# Patient Record
Sex: Female | Born: 1985 | Race: White | Hispanic: No | Marital: Single | Smoking: Current every day smoker
Health system: Southern US, Community
[De-identification: ages and names within clinical notes are randomized; demographics above are authoritative.]

## PROBLEM LIST (undated history)

## (undated) DIAGNOSIS — Z789 Other specified health status: Secondary | ICD-10-CM

## (undated) DIAGNOSIS — I10 Essential (primary) hypertension: Secondary | ICD-10-CM

## (undated) HISTORY — PX: LEEP: SHX91

## (undated) HISTORY — PX: CHOLECYSTECTOMY: SHX55

## (undated) HISTORY — PX: OTHER SURGICAL HISTORY: SHX169

---

## 2011-06-07 HISTORY — PX: LEEP: SHX91

## 2012-07-06 ENCOUNTER — Observation Stay: Payer: Self-pay | Admitting: Obstetrics and Gynecology

## 2013-02-14 ENCOUNTER — Ambulatory Visit: Payer: Self-pay | Admitting: Physician Assistant

## 2013-05-21 ENCOUNTER — Ambulatory Visit: Payer: Self-pay | Admitting: Family Medicine

## 2013-05-21 LAB — URINALYSIS, COMPLETE
Bacteria: NEGATIVE
Ketone: NEGATIVE
Ph: 7 (ref 4.5–8.0)
Protein: NEGATIVE

## 2013-12-07 ENCOUNTER — Emergency Department: Payer: Self-pay | Admitting: Emergency Medicine

## 2013-12-07 LAB — COMPREHENSIVE METABOLIC PANEL
ALT: 33 U/L (ref 12–78)
Albumin: 3.6 g/dL (ref 3.4–5.0)
Alkaline Phosphatase: 76 U/L
Anion Gap: 6 — ABNORMAL LOW (ref 7–16)
BUN: 11 mg/dL (ref 7–18)
Bilirubin,Total: 0.3 mg/dL (ref 0.2–1.0)
CALCIUM: 8.5 mg/dL (ref 8.5–10.1)
Chloride: 107 mmol/L (ref 98–107)
Co2: 25 mmol/L (ref 21–32)
Creatinine: 0.84 mg/dL (ref 0.60–1.30)
EGFR (African American): >60
Glucose: 99 mg/dL (ref 65–99)
Osmolality: 275 (ref 275–301)
Potassium: 3.5 mmol/L (ref 3.5–5.1)
SGOT(AST): 30 U/L (ref 15–37)
Sodium: 138 mmol/L (ref 136–145)
Total Protein: 7.5 g/dL (ref 6.4–8.2)

## 2013-12-07 LAB — LIPASE, BLOOD: LIPASE: 130 U/L (ref 73–393)

## 2013-12-07 LAB — CBC WITH DIFFERENTIAL/PLATELET
BASOS PCT: 0.4 %
Basophil #: 0.1 10*3/uL (ref 0.0–0.1)
EOS PCT: 0.7 %
Eosinophil #: 0.1 10*3/uL (ref 0.0–0.7)
HCT: 40.8 % (ref 35.0–47.0)
HGB: 13.8 g/dL (ref 12.0–16.0)
LYMPHS ABS: 4.7 10*3/uL — AB (ref 1.0–3.6)
LYMPHS PCT: 28.6 %
MCH: 30.9 pg (ref 26.0–34.0)
MCHC: 33.9 g/dL (ref 32.0–36.0)
MCV: 91 fL (ref 80–100)
Monocyte #: 0.7 x10 3/mm (ref 0.2–0.9)
Monocyte %: 4.1 %
Neutrophil #: 10.9 10*3/uL — ABNORMAL HIGH (ref 1.4–6.5)
Neutrophil %: 66.2 %
PLATELETS: 257 10*3/uL (ref 150–440)
RBC: 4.47 10*6/uL (ref 3.80–5.20)
RDW: 14.3 % (ref 11.5–14.5)
WBC: 16.4 10*3/uL — AB (ref 3.6–11.0)

## 2013-12-07 LAB — TROPONIN I: Troponin-I: <0.02 ng/mL

## 2013-12-08 LAB — URINALYSIS, COMPLETE
BLOOD: NEGATIVE
Bilirubin,UR: NEGATIVE
Glucose,UR: NEGATIVE mg/dL (ref 0–75)
KETONE: NEGATIVE
Leukocyte Esterase: NEGATIVE
Nitrite: NEGATIVE
PH: 5 (ref 4.5–8.0)
Protein: NEGATIVE
SPECIFIC GRAVITY: 1.025 (ref 1.003–1.030)

## 2013-12-08 LAB — SALICYLATE LEVEL: Salicylates, Serum: 5.1 mg/dL — ABNORMAL HIGH

## 2013-12-08 LAB — ACETAMINOPHEN LEVEL: Acetaminophen: <2 ug/mL

## 2014-02-08 ENCOUNTER — Emergency Department: Payer: Self-pay | Admitting: Emergency Medicine

## 2014-02-08 LAB — COMPREHENSIVE METABOLIC PANEL
ALK PHOS: 85 U/L
Albumin: 3.3 g/dL — ABNORMAL LOW (ref 3.4–5.0)
Anion Gap: 9 (ref 7–16)
BILIRUBIN TOTAL: 0.6 mg/dL (ref 0.2–1.0)
BUN: 6 mg/dL — ABNORMAL LOW (ref 7–18)
Calcium, Total: 9 mg/dL (ref 8.5–10.1)
Chloride: 110 mmol/L — ABNORMAL HIGH (ref 98–107)
Co2: 21 mmol/L (ref 21–32)
Creatinine: 0.69 mg/dL (ref 0.60–1.30)
EGFR (African American): >60
GLUCOSE: 108 mg/dL — AB (ref 65–99)
Osmolality: 278 (ref 275–301)
Potassium: 3.4 mmol/L — ABNORMAL LOW (ref 3.5–5.1)
SGOT(AST): 9 U/L — ABNORMAL LOW (ref 15–37)
SGPT (ALT): 17 U/L
Sodium: 140 mmol/L (ref 136–145)
TOTAL PROTEIN: 7.3 g/dL (ref 6.4–8.2)

## 2014-02-08 LAB — CBC WITH DIFFERENTIAL/PLATELET
Basophil #: 0.1 10*3/uL (ref 0.0–0.1)
Basophil %: 0.4 %
Eosinophil #: 0 10*3/uL (ref 0.0–0.7)
Eosinophil %: 0.2 %
HCT: 44.6 % (ref 35.0–47.0)
HGB: 15.1 g/dL (ref 12.0–16.0)
LYMPHS ABS: 2.8 10*3/uL (ref 1.0–3.6)
LYMPHS PCT: 15.3 %
MCH: 31.4 pg (ref 26.0–34.0)
MCHC: 33.7 g/dL (ref 32.0–36.0)
MCV: 93 fL (ref 80–100)
Monocyte #: 1.2 x10 3/mm — ABNORMAL HIGH (ref 0.2–0.9)
Monocyte %: 6.4 %
NEUTROS ABS: 14.5 10*3/uL — AB (ref 1.4–6.5)
Neutrophil %: 77.7 %
PLATELETS: 234 10*3/uL (ref 150–440)
RBC: 4.8 10*6/uL (ref 3.80–5.20)
RDW: 13.3 % (ref 11.5–14.5)
WBC: 18.6 10*3/uL — ABNORMAL HIGH (ref 3.6–11.0)

## 2014-06-14 ENCOUNTER — Ambulatory Visit: Payer: Self-pay | Admitting: Physician Assistant

## 2014-06-14 LAB — RAPID STREP-A WITH REFLX: Micro Text Report: NEGATIVE

## 2014-06-17 LAB — BETA STREP CULTURE(ARMC)

## 2014-11-03 ENCOUNTER — Encounter: Payer: Self-pay | Admitting: Emergency Medicine

## 2014-11-03 ENCOUNTER — Ambulatory Visit
Admission: EM | Admit: 2014-11-03 | Discharge: 2014-11-03 | Disposition: A | Discharge status: Home / Self Care | Payer: Medicaid Other | Attending: Family Medicine | Admitting: Family Medicine

## 2014-11-03 DIAGNOSIS — Z9049 Acquired absence of other specified parts of digestive tract: Secondary | ICD-10-CM | POA: Insufficient documentation

## 2014-11-03 DIAGNOSIS — N940 Mittelschmerz: Secondary | ICD-10-CM | POA: Diagnosis not present

## 2014-11-03 DIAGNOSIS — R109 Unspecified abdominal pain: Secondary | ICD-10-CM | POA: Diagnosis present

## 2014-11-03 DIAGNOSIS — F17210 Nicotine dependence, cigarettes, uncomplicated: Secondary | ICD-10-CM | POA: Insufficient documentation

## 2014-11-03 LAB — URINALYSIS COMPLETE WITH MICROSCOPIC (ARMC ONLY)
BACTERIA UA: NONE SEEN — AB
BILIRUBIN URINE: NEGATIVE
Glucose, UA: NEGATIVE mg/dL
Hgb urine dipstick: NEGATIVE
Ketones, ur: NEGATIVE mg/dL
LEUKOCYTES UA: NEGATIVE
NITRITE: NEGATIVE
PROTEIN: NEGATIVE mg/dL
RBC / HPF: NONE SEEN RBC/hpf (ref <3)
Specific Gravity, Urine: 1.015 (ref 1.005–1.030)
pH: 6 (ref 5.0–8.0)

## 2014-11-03 LAB — PREGNANCY, URINE: Preg Test, Ur: NEGATIVE

## 2014-11-03 NOTE — ED Notes (Signed)
Patient c/o abdominal cramps that started today.  Patient reports clear vaginal discharge for a week.  Patient denies vomiting.  Patient reports some nausea.

## 2014-11-03 NOTE — ED Provider Notes (Signed)
CSN: [id number]     Arrival date & time 11/03/14  1457 History   First MD Initiated Contact with Patient 11/03/14 1536     Chief Complaint  Patient presents with  . Abdominal Cramping   (Consider location/radiation/quality/duration/timing/severity/associated sxs/prior Treatment) HPI 29 yo F para 303 . New relationship. Wants pregnancy.Had Implanon removed April 29. Developed menses until May 4. Had one day of brown spotting May 16. Unprotected sex. Noted clear vaginal discharge jelly few days ago, lower left abdominal discomfort, mild  History reviewed. No pertinent past medical history. Past Surgical History  Procedure Laterality Date  . Cholecystectomy     History reviewed. No pertinent family history. History  Substance Use Topics  . Smoking status: Current Every Day Smoker -- 0.50 packs/day    Types: Cigarettes  . Smokeless tobacco: Never Used  . Alcohol Use: No   OB History    No data available     3 children ages 7-3-2, previous relationship Review of Systems  Review of 10 systems all negative except as referenced in HPI  Allergies  Penicillins  Home Medications   Prior to Admission medications   Not on File   BP 111/73 mmHg  Pulse 102  Temp(Src) 97.3 F (36.3 C) (Tympanic)  Resp 16  Ht  (1.6 m)  Wt 210 lb (95.255 kg)  BMI 37.21 kg/m2  SpO2 100%  LMP 09/29/2014 (Approximate) Physical Exam Constitutional -alert and oriented,well appearing and in no acute distress,  Head-atraumatic Eyes- EOMI ,conjugate gaze Nose- no congestion or rhinorrhea Mouth/throat- mucous membranes moist , Neck- supple CV- regular , grossly normal heart sounds, Resp-no distress,clear to auscultation bilaterally GI- soft,non-tender,no distention,overweight GU- EG BUS- WNL; marital introitus,adequate support; vault clean, cervix clear, copious ovulatory type clear mucous at os ;positive spinnbarkeit. Uterus is ML,NSS; right adnexa negative, left adnexa with slightly tender plump  ovary; RV defered MSK- no lower extremity tenderness nor edema,no joint effusion, ambulatory Neuro- normal speech and language,  Skin-warm,dry ,intact; no rash noted Psych-mood and affect grossly normal; speech and behavior grossly normal ED Course  Procedures (including critical care time) Labs Review Labs Reviewed  URINALYSIS COMPLETEWITH MICROSCOPIC ([facility] ONLY) - Abnormal; Notable for the following:    Bacteria, UA NONE SEEN (*)    Squamous Epithelial / LPF 6-30 (*)    All other components within normal limits  PREGNANCY, URINE   Results for orders placed or performed during the hospital encounter of 11/03/14  Pregnancy, urine  Result Value Ref Range   Preg Test, Ur NEGATIVE NEGATIVE  Urinalysis complete, with microscopic  Result Value Ref Range   Color, Urine YELLOW YELLOW   APPearance CLEAR CLEAR   Glucose, UA NEGATIVE NEGATIVE mg/dL   Bilirubin Urine NEGATIVE NEGATIVE   Ketones, ur NEGATIVE NEGATIVE mg/dL   Specific Gravity, Urine 1.015 1.005 - 1.030   Hgb urine dipstick NEGATIVE NEGATIVE   pH 6.0 5.0 - 8.0   Protein, ur NEGATIVE NEGATIVE mg/dL   Nitrite NEGATIVE NEGATIVE   Leukocytes, UA NEGATIVE NEGATIVE   RBC / HPF NONE SEEN <3 RBC/hpf   WBC, UA 0-5 <3 WBC/hpf   Bacteria, UA NONE SEEN (A) RARE   Squamous Epithelial / LPF 6-30 (A) RARE     Imaging Review No results found.   MDM   1. Mittelschmerz    Discussed ovulation and re-establishing menstrual cycles as natural precursor to accomplishing pregnancy. Patient very excited to get pregnant and has already done home pregnancy tests ( negative). Encouraged her to consider  waiting and using protection until she experiences a menstrual cycle to use as a baseline but she defers and plans to try to use this apparent ovulation.States it has always been very easy for her to achieve pregnancy. Currently significantly overweight- encouraged healthy food choices and portion control; daily 20-40 minute walks while  preparing.  Diagnosis and treatment discussed. . Questions fielded, expectations and recommendations reviewed. Patient expresses understanding. [facility] concern or exacerbation.      Rae HalstedLaurie W Bethene Hankinson, PA-C 11/04/14 [phone]

## 2014-11-03 NOTE — Discharge Instructions (Signed)
Mittelschmerz       Mittelschmerz is lower belly (abdominal) pain that happens between your periods (menstrual periods). The pain may be felt right before, during, or after your ovary releases an egg (ovulation).   HOME CARE   Only take medicines as told by your doctor. Do not take aspirin.   Write down when the pain starts. Write down how bad it is, if you had a fever with the pain, and how long the pain lasted.  GET HELP RIGHT AWAY IF:   You have more pain and medicine does not help.   You start bleeding from your vagina (more than spots of blood) and have pain.   You feel sick to your stomach (nauseous) and throw up (vomit).   You have a fever.   You feel lightheaded and pass out (faint).  MAKE SURE YOU:   Understand these instructions.   Will watch your condition.   Will get help right away if you are not doing well or get worse.  Document Released: 06/30/2004 Document Revised: 08/15/2011 Document Reviewed: 09/20/2010   ExitCare® Patient Information ©2015 [employer]. This information is not intended to replace advice given to you by your health care provider. Make sure you discuss any questions you have with your health care provider.

## 2015-01-02 ENCOUNTER — Emergency Department
Admission: EM | Admit: 2015-01-02 | Discharge: 2015-01-02 | Discharge status: Against Medical Advice | Payer: Medicaid Other | Attending: Emergency Medicine | Admitting: Emergency Medicine

## 2015-01-02 ENCOUNTER — Encounter: Payer: Self-pay | Admitting: Emergency Medicine

## 2015-01-02 DIAGNOSIS — N939 Abnormal uterine and vaginal bleeding, unspecified: Secondary | ICD-10-CM | POA: Insufficient documentation

## 2015-01-02 DIAGNOSIS — Z720 Tobacco use: Secondary | ICD-10-CM | POA: Insufficient documentation

## 2015-01-02 DIAGNOSIS — Z880 Allergy status to penicillin: Secondary | ICD-10-CM | POA: Diagnosis not present

## 2015-01-02 DIAGNOSIS — R252 Cramp and spasm: Secondary | ICD-10-CM | POA: Diagnosis not present

## 2015-01-02 DIAGNOSIS — R110 Nausea: Secondary | ICD-10-CM | POA: Diagnosis not present

## 2015-01-02 LAB — HCG, QUANTITATIVE, PREGNANCY

## 2015-01-02 NOTE — ED Notes (Signed)
Patient states that she took a home pregnancy test on Tuesday and that it was positive. Patient reports that tonight she has had a small amount of vaginal bleeding. Patient denies any abd cramping. Patient reports that she has had a previous miscarriage and wanted to be evaluated.

## 2015-01-02 NOTE — ED Provider Notes (Signed)
[facility] Emergency Department Provider Note  ____________________________________________  Time seen: Approximately 11:12 PM  I have reviewed the triage vital signs and the nursing notes.   HISTORY  Chief Complaint Vaginal Bleeding    HPI Lisa Adkins is a 29 y.o. female who reports that she took a pregnancy test approximately 3 days ago and saw that it was positive. The patient reports that tonight when she was urinating she saw some light pink blood on the toilet paper. Her last menstrual period was on 76 and lasted approximately 4 days. The patient reports that since this positive pregnancy test she has not yet scheduled to see an OB/GYN. The patient denies any abdominal pain but does have some nausea with no vomiting. She reports that she does have some mild leg cramps but they are not uncomfortable. The patient is a G4 P3 013. She had her Implanon removed 3 months ago and is currently trying to conceive. A shunt was concerned that she's had a miscarriage in the past and wanted to be evaluated for possible miscarriage.   No past medical history on file.  There are no active problems to display for this patient.   Past Surgical History  Procedure Laterality Date  . Cholecystectomy      No current outpatient prescriptions on file.  Allergies Penicillins  No family history on file.  Social History History  Substance Use Topics  . Smoking status: Current Every Day Smoker -- 0.50 packs/day for 4.5 years    Types: Cigarettes  . Smokeless tobacco: Never Used  . Alcohol Use: No    Review of Systems Constitutional: No fever/chills Eyes: No visual changes. ENT: No sore throat. Cardiovascular: Denies chest pain. Respiratory: Denies shortness of breath. Gastrointestinal: Nausea with No abdominal pain.  no vomiting.  No diarrhea.  No constipation. Genitourinary: Negative for dysuria. Musculoskeletal: Negative for back pain. Skin: Negative for  rash. Neurological: Negative for headaches, focal weakness or numbness.  10-point ROS otherwise negative.  ____________________________________________   PHYSICAL EXAM:  VITAL SIGNS: ED Triage Vitals  Enc Vitals Group     BP 01/02/15 2127 127/72 mmHg     Pulse Rate 01/02/15 2127 81     Resp 01/02/15 2127 18     Temp 01/02/15 2127 98.9 F (37.2 C)     Temp Source 01/02/15 2127 Oral     SpO2 01/02/15 2127 98 %     Weight 01/02/15 2127 214 lb (97.07 kg)     Height 01/02/15 2127 5\' 3"  (1.6 m)     Head Cir --      Peak Flow --      Pain Score --      Pain Loc --      Pain Edu? --      Excl. in GC? --     Constitutional: Alert and oriented. Well appearing and in no acute distress. Eyes: Conjunctivae are normal. PERRL. EOMI. Head: Atraumatic. Nose: No congestion/rhinnorhea. Mouth/Throat: Mucous membranes are moist.  Oropharynx non-erythematous. Cardiovascular: Normal rate, regular rhythm. Grossly normal heart sounds.  Good peripheral circulation. Respiratory: Normal respiratory effort.  No retractions. Lungs CTAB. Gastrointestinal: Soft and nontender. No distention. Positive bowel sounds Musculoskeletal: No lower extremity tenderness nor edema.  No joint effusions. Neurologic:  Normal speech and language. No gross focal neurologic deficits are appreciated. No gait instability. Skin:  Skin is warm, dry and intact. No rash noted. Psychiatric: Mood and affect are normal.  ____________________________________________   LABS (all labs ordered  are listed, but only abnormal results are displayed)  Labs Reviewed  WET PREP, GENITAL  CHLAMYDIA/NGC RT PCR ([facility] ONLY)  HCG, QUANTITATIVE, PREGNANCY   ____________________________________________  EKG  None ____________________________________________  RADIOLOGY  None ____________________________________________   PROCEDURES  Procedure(s) performed: None  Critical Care performed:  No  ____________________________________________   INITIAL IMPRESSION / ASSESSMENT AND PLAN / ED COURSE  Pertinent labs & imaging results that were available during my care of the patient were reviewed by me and considered in my medical decision making (see chart for details).  This is a 29 year old female who comes in today with vaginal bleeding after a positive pregnancy test at home multiple days ago. The patient did receive a quantitative beta hCG that was negative showing a result that was less than 1. I discussed this with the patient that it is possible she may have had a false positive urine pregnancy test as the blood test would be positive if the urine test was truly positive. I stepped out of the room with the plan to return for a pelvic exam and at that point the patient told the nurse that she did not want to stay in the hospital anymore and she had to go home to care for her other children. The patient decided to sign out AGAINST MEDICAL ADVICE as we did not fully evaluate the cause of her pink tinged discharge. I was unable to discuss this with the patient prior to her leaving the emergency department. ____________________________________________   FINAL CLINICAL IMPRESSION(S) / ED DIAGNOSES  Final diagnoses:  Vaginal bleeding      Rebecka Apley, MD 01/03/15 [phone]

## 2015-01-02 NOTE — ED Notes (Signed)
The patient is dressed and out in the hallway, she states that she has to get home to her kids because her husband has to work, she does not have time to wait to be examined. Dr. Zenda Alpers notified of the same, aware pt will sign out ama

## 2015-03-06 ENCOUNTER — Emergency Department
Admission: EM | Admit: 2015-03-06 | Discharge: 2015-03-06 | Discharge status: Against Medical Advice | Payer: Medicaid Other | Attending: Emergency Medicine | Admitting: Emergency Medicine

## 2015-03-06 ENCOUNTER — Encounter: Payer: Self-pay | Admitting: Emergency Medicine

## 2015-03-06 DIAGNOSIS — F17210 Nicotine dependence, cigarettes, uncomplicated: Secondary | ICD-10-CM | POA: Insufficient documentation

## 2015-03-06 DIAGNOSIS — O9989 Other specified diseases and conditions complicating pregnancy, childbirth and the puerperium: Secondary | ICD-10-CM | POA: Insufficient documentation

## 2015-03-06 DIAGNOSIS — R109 Unspecified abdominal pain: Secondary | ICD-10-CM | POA: Diagnosis not present

## 2015-03-06 DIAGNOSIS — O99331 Smoking (tobacco) complicating pregnancy, first trimester: Secondary | ICD-10-CM | POA: Diagnosis not present

## 2015-03-06 DIAGNOSIS — Z3A01 Less than 8 weeks gestation of pregnancy: Secondary | ICD-10-CM | POA: Insufficient documentation

## 2015-03-06 LAB — URINALYSIS COMPLETE WITH MICROSCOPIC (ARMC ONLY)
BILIRUBIN URINE: NEGATIVE
Glucose, UA: NEGATIVE mg/dL
Hgb urine dipstick: NEGATIVE
Ketones, ur: NEGATIVE mg/dL
Nitrite: NEGATIVE
PH: 5 (ref 5.0–8.0)
PROTEIN: NEGATIVE mg/dL
SPECIFIC GRAVITY, URINE: 1.019 (ref 1.005–1.030)

## 2015-03-06 LAB — POCT PREGNANCY, URINE: Preg Test, Ur: POSITIVE — AB

## 2015-03-06 NOTE — ED Notes (Signed)
Pt with abd cramping today and states she is [redacted] weeks pregnant. This is pts fifth pregnancy. Denies any bleeding.

## 2015-03-13 ENCOUNTER — Encounter: Payer: Self-pay | Admitting: Emergency Medicine

## 2015-03-13 ENCOUNTER — Ambulatory Visit
Admission: EM | Admit: 2015-03-13 | Discharge: 2015-03-13 | Disposition: A | Discharge status: Home / Self Care | Payer: Medicaid Other | Attending: Family Medicine | Admitting: Family Medicine

## 2015-03-13 DIAGNOSIS — J069 Acute upper respiratory infection, unspecified: Secondary | ICD-10-CM | POA: Insufficient documentation

## 2015-03-13 DIAGNOSIS — N390 Urinary tract infection, site not specified: Secondary | ICD-10-CM | POA: Insufficient documentation

## 2015-03-13 DIAGNOSIS — H6121 Impacted cerumen, right ear: Secondary | ICD-10-CM | POA: Insufficient documentation

## 2015-03-13 LAB — URINALYSIS COMPLETE WITH MICROSCOPIC (ARMC ONLY)
Bilirubin Urine: NEGATIVE
Glucose, UA: NEGATIVE mg/dL
Hgb urine dipstick: NEGATIVE
KETONES UR: NEGATIVE mg/dL
Nitrite: NEGATIVE
PH: 6 (ref 5.0–8.0)
PROTEIN: NEGATIVE mg/dL
SPECIFIC GRAVITY, URINE: 1.02 (ref 1.005–1.030)

## 2015-03-13 LAB — RAPID STREP SCREEN (MED CTR MEBANE ONLY): Streptococcus, Group A Screen (Direct): NEGATIVE

## 2015-03-13 MED ORDER — NITROFURANTOIN MONOHYD MACRO 100 MG PO CAPS: 100.0000 mg | ORAL_CAPSULE | Freq: Two times a day (BID) | ORAL | Status: DC

## 2015-03-13 NOTE — Discharge Instructions (Signed)
Take medication as prescribed. Rest. Drink plenty of fluids. Take over-the-counter Tylenol only as needed. Rest.  Follow-up with your OB/GYN next week as scheduled. Return to urgent care as needed for new or worsening concerns. Seek immediate care for abdominal pain, vaginal bleeding chest pain or shortness of breath, or worsening concerns.  Upper Respiratory Infection, Adult Most upper respiratory infections (URIs) are a viral infection of the air passages leading to the lungs. A URI affects the nose, throat, and upper air passages. The most common type of URI is nasopharyngitis and is typically referred to as "the common cold." URIs run their course and usually go away on their own. Most of the time, a URI does not require medical attention, but sometimes a bacterial infection in the upper airways can follow a viral infection. This is called a secondary infection. Sinus and middle ear infections are common types of secondary upper respiratory infections. Bacterial pneumonia can also complicate a URI. A URI can worsen asthma and chronic obstructive pulmonary disease (COPD). Sometimes, these complications can require emergency medical care and may be life threatening.  CAUSES Almost all URIs are caused by viruses. A virus is a type of germ and can spread from one person to another.  RISKS FACTORS You may be at risk for a URI if:   You smoke.   You have chronic heart or lung disease.  You have a weakened defense (immune) system.   You are very young or very old.   You have nasal allergies or asthma.  You work in crowded or poorly ventilated areas.  You work in health care facilities or schools. SIGNS AND SYMPTOMS  Symptoms typically develop 2-3 days after you come in contact with a cold virus. Most viral URIs last 7-10 days. However, viral URIs from the influenza virus (flu virus) can last 14-18 days and are typically more severe. Symptoms may include:   Runny or stuffy (congested)  nose.   Sneezing.   Cough.   Sore throat.   Headache.   Fatigue.   Fever.   Loss of appetite.   Pain in your forehead, behind your eyes, and over your cheekbones (sinus pain).  Muscle aches.  DIAGNOSIS  Your health care provider may diagnose a URI by:  Physical exam.  Tests to check that your symptoms are not due to another condition such as:  Strep throat.  Sinusitis.  Pneumonia.  Asthma. TREATMENT  A URI goes away on its own with time. It cannot be cured with medicines, but medicines may be prescribed or recommended to relieve symptoms. Medicines may help:  Reduce your fever.  Reduce your cough.  Relieve nasal congestion. HOME CARE INSTRUCTIONS   Take medicines only as directed by your health care provider.   Gargle warm saltwater or take cough drops to comfort your throat as directed by your health care provider.  Use a warm mist humidifier or inhale steam from a shower to increase air moisture. This may make it easier to breathe.  Drink enough fluid to keep your urine clear or pale yellow.   Eat soups and other clear broths and maintain good nutrition.   Rest as needed.   Return to work when your temperature has returned to normal or as your health care provider advises. You may need to stay home longer to avoid infecting others. You can also use a face mask and careful hand washing to prevent spread of the virus.  Increase the usage of your inhaler if you have  asthma.   Do not use any tobacco products, including cigarettes, chewing tobacco, or electronic cigarettes. If you need help quitting, ask your health care provider. PREVENTION  The best way to protect yourself from getting a cold is to practice good hygiene.   Avoid oral or hand contact with people with cold symptoms.   Wash your hands often if contact occurs.  There is no clear evidence that vitamin C, vitamin E, echinacea, or exercise reduces the chance of developing a  cold. However, it is always recommended to get plenty of rest, exercise, and practice good nutrition.  SEEK MEDICAL CARE IF:   You are getting worse rather than better.   Your symptoms are not controlled by medicine.   You have chills.  You have worsening shortness of breath.  You have brown or red mucus.  You have yellow or brown nasal discharge.  You have pain in your face, especially when you bend forward.  You have a fever.  You have swollen neck glands.  You have pain while swallowing.  You have white areas in the back of your throat. SEEK IMMEDIATE MEDICAL CARE IF:   You have severe or persistent:  Headache.  Ear pain.  Sinus pain.  Chest pain.  You have chronic lung disease and any of the following:  Wheezing.  Prolonged cough.  Coughing up blood.  A change in your usual mucus.  You have a stiff neck.  You have changes in your:  Vision.  Hearing.  Thinking.  Mood. MAKE SURE YOU:   Understand these instructions.  Will watch your condition.  Will get help right away if you are not doing well or get worse.   This information is not intended to replace advice given to you by your health care provider. Make sure you discuss any questions you have with your health care provider.   Document Released: 11/16/2000 Document Revised: 10/07/2014 Document Reviewed: 08/28/2013 Elsevier Interactive Patient Education 2016 [employer]  Urinary Tract Infection Urinary tract infections (UTIs) can develop anywhere along your urinary tract. Your urinary tract is your body's drainage system for removing wastes and extra water. Your urinary tract includes two kidneys, two ureters, a bladder, and a urethra. Your kidneys are a pair of bean-shaped organs. Each kidney is about the size of your fist. They are located below your ribs, one on each side of your spine. CAUSES Infections are caused by microbes, which are microscopic organisms, including fungi,  viruses, and bacteria. These organisms are so small that they can only be seen through a microscope. Bacteria are the microbes that most commonly cause UTIs. SYMPTOMS  Symptoms of UTIs may vary by age and gender of the patient and by the location of the infection. Symptoms in young women typically include a frequent and intense urge to urinate and a painful, burning feeling in the bladder or urethra during urination. Older women and men are more likely to be tired, shaky, and weak and have muscle aches and abdominal pain. A fever may mean the infection is in your kidneys. Other symptoms of a kidney infection include pain in your back or sides below the ribs, nausea, and vomiting. DIAGNOSIS To diagnose a UTI, your caregiver will ask you about your symptoms. Your caregiver will also ask you to provide a urine sample. The urine sample will be tested for bacteria and white blood cells. White blood cells are made by your body to help fight infection. TREATMENT  Typically, UTIs can be treated with  medication. Because most UTIs are caused by a bacterial infection, they usually can be treated with the use of antibiotics. The choice of antibiotic and length of treatment depend on your symptoms and the type of bacteria causing your infection. HOME CARE INSTRUCTIONS  If you were prescribed antibiotics, take them exactly as your caregiver instructs you. Finish the medication even if you feel better after you have only taken some of the medication.  Drink enough water and fluids to keep your urine clear or pale yellow.  Avoid caffeine, tea, and carbonated beverages. They tend to irritate your bladder.  Empty your bladder often. Avoid holding urine for long periods of time.  Empty your bladder before and after sexual intercourse.  After a bowel movement, women should cleanse from front to back. Use each tissue only once. SEEK MEDICAL CARE IF:   You have back pain.  You develop a fever.  Your symptoms do  not begin to resolve within 3 days. SEEK IMMEDIATE MEDICAL CARE IF:   You have severe back pain or lower abdominal pain.  You develop chills.  You have nausea or vomiting.  You have continued burning or discomfort with urination. MAKE SURE YOU:   Understand these instructions.  Will watch your condition.  Will get help right away if you are not doing well or get worse.   This information is not intended to replace advice given to you by your health care provider. Make sure you discuss any questions you have with your health care provider.   Document Released: 03/02/2005 Document Revised: 02/11/2015 Document Reviewed: 07/01/2011 Elsevier Interactive Patient Education [employer]

## 2015-03-13 NOTE — ED Notes (Signed)
Congestion, cough, fever for 2 days. Patient is [redacted] weeks pregnant.

## 2015-03-13 NOTE — ED Provider Notes (Signed)
[facility] Emergency Department Provider Note  ____________________________________________  Time seen: Approximately 10:04 AM  I have reviewed the triage vital signs and the nursing notes.   HISTORY  Chief Complaint URI   HPI JALAYA SARVER is a 29 y.o. female presents for complaints of runny nose, nasal congestion, mild sore throat, intermittent cough x 2-3 days. Reports her 28 year old was sick with similar earlier this week, and states that as of yesterday her 28 year old also has similar. Reports continues to eat and drink well. States felt like she may have had a "low grade" fever yesterday. Denies nausea, vomiting, abdominal pain, vaginal bleeding, vaginal discharge, dysuria or other complaints. Does reports she feels she is urinating more frequently than normal for her pregnancies. Denies pain with urination.   Reports she is [redacted] weeks pregnant. Reports that she has not had any complications with this pregnancy. Reports that she is gravida 5 para 3 with a history of 1 miscarriage. Again denies vaginal bleeding, vaginal discharge, dysuria or abdominal pain. Denies pregnancy complaints. Reports she has a follow-up appointment with her OB/GYN next week. [facility].   Patient denies chest pain, shortness of breath, abdominal pain, nausea, vomiting, diarrhea, back pain, leg pain, calf pain, swelling or headache. Reports continues to eat and drink well.    History reviewed. No pertinent past medical history.  There are no active problems to display for this patient.   Past Surgical History  Procedure Laterality Date  . Cholecystectomy      Current Outpatient Rx  Name  Route  Sig  Dispense  Refill  . Prenatal Vit-Fe Fumarate-FA (MULTIVITAMIN-PRENATAL) 27-0.8 MG TABS tablet   Oral   Take 1 tablet by mouth daily at 12 noon.           Allergies Penicillins  No family history on file.  Social History Social History  Substance Use Topics  .  Smoking status: Current Every Day Smoker -- 0.50 packs/day for 4.5 years    Types: Cigarettes  . Smokeless tobacco: Never Used  . Alcohol Use: No    Review of Systems Constitutional: "felt warm" Eyes: No visual changes. ENT: positive runny nose, congestion, sore throat and intermittent cough.  Cardiovascular: Denies chest pain. Respiratory: Denies shortness of breath. Gastrointestinal: No abdominal pain.  No nausea, no vomiting.  No diarrhea.  No constipation. Genitourinary: Negative for dysuria. Musculoskeletal: Negative for back pain. Skin: Negative for rash. Neurological: Negative for headaches, focal weakness or numbness.  10-point ROS otherwise negative.  ____________________________________________   PHYSICAL EXAM:  VITAL SIGNS: ED Triage Vitals  Enc Vitals Group     BP 03/13/15 0919 101/88 mmHg     Pulse Rate 03/13/15 0919 101, rechecked 86     Resp 03/13/15 0919 18     Temp 03/13/15 0919 99 F (37.2 C)     Temp Source 03/13/15 0919 Tympanic     SpO2 03/13/15 0919 97 %     Weight 03/13/15 0919 218 lb (98.884 kg)     Height 03/13/15 0919  (1.6 m)     Head Cir --      Peak Flow --      Pain Score 03/13/15 0922 0     Pain Loc --      Pain Edu? --      Excl. in GC? --    Today's Vitals   03/13/15 0919 03/13/15 0922 03/13/15 1100  BP: 101/88  115/61  Pulse: 101  88  Temp: 99 F (37.2 C)    TempSrc: Tympanic    Resp: 18  18  Height: 5\' 3"  (1.6 m)    Weight: 218 lb (98.884 kg)    SpO2: 97%  100%  PainSc:  0-No pain       Constitutional: Alert and oriented. Well appearing and in no acute distress. Eyes: Conjunctivae are normal. PERRL. EOMI. Head: Atraumatic. No sinus TTP.   Ears: no erythema bilaterally. Left: normal TM no erythema. Right: total cerumen impaction, irrigated by RN, post irrigation clear canal, TM  Normal, no erythema.   Nose: mild rhinorrhea, mild bilateral nasal turbinate edema. Nares patent.   Mouth/Throat: Mucous membranes are  moist.  Minimal pharyngeal erythema. No tonsillar swelling or exudate. No uvular shift or  deviation.  Neck: No stridor.  No cervical spine tenderness to palpation. Hematological/Lymphatic/Immunilogical: No cervical lymphadenopathy. Cardiovascular: Normal rate, regular rhythm. Grossly normal heart sounds.  Good peripheral circulation. Respiratory: Normal respiratory effort.  No retractions. Lungs CTAB.No wheezes, rales or rhonchi.  Gastrointestinal: Soft and nontender. Obese abdomen. Normal Bowel sounds.No CVA tenderness. Musculoskeletal: No lower or upper extremity tenderness nor edema.  No joint effusions. Bilateral pedal pulses equal and easily palpated. No cervical, thoracic or lumbar TTP.  Neurologic:  Normal speech and language. No gross focal neurologic deficits are appreciated. No gait instability. Skin:  Skin is warm, dry and intact. No rash noted. Psychiatric: Mood and affect are normal. Speech and behavior are normal.  ____________________________________________   LABS (all labs ordered are listed, but only abnormal results are displayed)  Labs Reviewed  URINALYSIS COMPLETEWITH MICROSCOPIC ([facility] ONLY) - Abnormal; Notable for the following:    APPearance HAZY (*)    Leukocytes, UA TRACE (*)    Squamous Epithelial / LPF Bacteria 6-30 (*) Bacteria    All other components within normal limits  RAPID STREP SCREEN (NOT AT [facility])  CULTURE, GROUP A STREP ([facility] ONLY)  URINE CULTURE     PROCEDURES  Procedure(s) performed:  Procedure explained. Verbal consent obtained.  Right ear irrigated by RN. Post irrigation canal clear. Patient also reports that ear feels better and she can "I can hear better. " Patient tolerated well.  ____________________________________________   INITIAL IMPRESSION / ASSESSMENT AND PLAN / ED COURSE  Pertinent labs & imaging results that were available during my care of the patient were reviewed by me and considered in my medical decision making (see  chart for details).  Very well-appearing patient. No acute distress. Presents for complaints of 2-3 days of runny nose, intermittent cough, congestion and sore throat. Patient reports 2 children at home with similar. States her children are getting better. Denies chest pain, shortness of breath or abdominal pain. Denies vaginal bleeding or discharge.States she is urinating more frequently.   Very well-appearing patient. Abdomen soft and nontender. Lungs clear throughout. No wheezes rales or rhonchi.Strep negative, will culture. Right ear cerumen impaction removal with irrigation, tolerated well. Reports right ear feels better after irrigation. Patient with upper respiratory infection, suspect viral. Patient also reports some urinary frequency. Rare bacteria trace leukocytes and hazy appearance. Patient reports clean sample. Concern for urinary tract infection. Will culture urine. As patient is pregnant as well as penicillin allergic with a history of anaphylactic reaction, will treat urinary tract infection with Macrobid. Discussed water hydration, rest, prn tylenol if needed. Discussed close PCP or OBGYN follow up. Discussed follow up with Primary care physician this week. Discussed follow up and return parameters including no resolution or any  worsening concerns. Patient verbalized understanding and agreed to plan.   ____________________________________________    FINAL CLINICAL IMPRESSION(S) / ED DIAGNOSES  Final diagnoses:  Upper respiratory infection  UTI (lower urinary tract infection)  Right cerumen impaction.     Renford Dills, NP 03/13/15 1420

## 2015-03-15 LAB — URINE CULTURE: SPECIAL REQUESTS: NORMAL

## 2015-03-16 LAB — CULTURE, GROUP A STREP (THRC)

## 2015-03-17 ENCOUNTER — Telehealth: Payer: Self-pay | Admitting: Emergency Medicine

## 2015-03-17 MED ORDER — AZITHROMYCIN 250 MG PO TABS: ORAL_TABLET | ORAL | Status: DC

## 2015-03-17 NOTE — Telephone Encounter (Signed)
Called and spoke with patient regarding strep culture results. Patient reports she is feeling much better. States urinary frequency has resolved. States cough and congestion improved. States still with mild sore throat. States eating and drinking well. Strep culture results discussed. As  Strep culture results positive for moderate growth strep will place patient on azithromycin 500 mg day one then 250 mg days 2-5 as patient pregnant and PCN allergic. Reports has OBGYN appt in 2 days and will follow up regarding this . Again patient reports she feels much better.. Discussed follow up and return parameters including no resolution or any worsening concerns. Patient verbalized understanding and agreed to plan.     MODERATE GROWTH STREPTOCOCCUS AGALACTIAE  Virtually 100% of S. agalactiae (Group B) strains are susceptible to Penicillin. For Penicillin-allergic patients, Erythromycin (85-95% sensitive) and Clindamycin (80% sensitive) are drugs of choice. Contact microbiology lab to request sensitivities if  needed within 7 days.

## 2015-06-07 NOTE — L&D Delivery Note (Addendum)
Delivery Note At 5:32 PM a viable female was delivered via Vaginal, Spontaneous Delivery (Presentation: Middle Occiput Anterior).  APGAR:.3,9 ; weight 7-15 (3620).   Placenta status: Intact, Spontaneous.  Cord: 3 vessels with the following complications: None.  Cord pH: gasses not sent per NICU   Anesthesia: Epidural  Episiotomy: None Lacerations: None Suture Repair:n/a Est. Blood Loss (mL): 200cc  Mom to postpartum.  Baby to Couplet care / Skin to Skin.  Due to history of precipitous delivery (and not making it to hospital in time) patient requested IOL at 39 weeks for controlled delivery.  She was given cervidil then augmented with pitocin and AROM.  Epidural was placed and she progressed to complete with short second stage.  Viable female was delivered without difficulty, and placed on mom's chest.  After about 30 seconds baby was not vigorous and still blue; cord was doubly clamped and cut and pediatric team took the baby to the warmer where she perked up immediately.  Section of cord was obtained for gasses, but NICU declined study.  Placenta was spontaneously delivered and intact.  No lacerations.  Pitocin was bolused for hemorrhage prophylaxis.  We [facility] birthday [name].  Chelsea C Ward 10/29/2015, 5:53 PM

## 2015-10-28 ENCOUNTER — Inpatient Hospital Stay
Admission: EM | Admit: 2015-10-28 | Discharge: 2015-10-31 | DRG: 775 | Disposition: A | Discharge status: Home / Self Care | Payer: Medicaid Other | Attending: Obstetrics & Gynecology | Admitting: Obstetrics & Gynecology

## 2015-10-28 DIAGNOSIS — F17210 Nicotine dependence, cigarettes, uncomplicated: Secondary | ICD-10-CM | POA: Diagnosis present

## 2015-10-28 DIAGNOSIS — O99343 Other mental disorders complicating pregnancy, third trimester: Secondary | ICD-10-CM | POA: Diagnosis present

## 2015-10-28 DIAGNOSIS — O99213 Obesity complicating pregnancy, third trimester: Secondary | ICD-10-CM | POA: Diagnosis present

## 2015-10-28 DIAGNOSIS — F329 Major depressive disorder, single episode, unspecified: Secondary | ICD-10-CM | POA: Diagnosis present

## 2015-10-28 DIAGNOSIS — Z6841 Body Mass Index (BMI) 40.0 and over, adult: Secondary | ICD-10-CM | POA: Diagnosis not present

## 2015-10-28 DIAGNOSIS — O99824 Streptococcus B carrier state complicating childbirth: Principal | ICD-10-CM | POA: Diagnosis present

## 2015-10-28 DIAGNOSIS — O139 Gestational [pregnancy-induced] hypertension without significant proteinuria, unspecified trimester: Secondary | ICD-10-CM | POA: Diagnosis not present

## 2015-10-28 DIAGNOSIS — Z3483 Encounter for supervision of other normal pregnancy, third trimester: Secondary | ICD-10-CM | POA: Diagnosis present

## 2015-10-28 DIAGNOSIS — O99214 Obesity complicating childbirth: Secondary | ICD-10-CM | POA: Diagnosis present

## 2015-10-28 DIAGNOSIS — Z79899 Other long term (current) drug therapy: Secondary | ICD-10-CM | POA: Diagnosis not present

## 2015-10-28 DIAGNOSIS — O3440 Maternal care for other abnormalities of cervix, unspecified trimester: Secondary | ICD-10-CM | POA: Diagnosis present

## 2015-10-28 DIAGNOSIS — E669 Obesity, unspecified: Secondary | ICD-10-CM | POA: Diagnosis present

## 2015-10-28 DIAGNOSIS — O99340 Other mental disorders complicating pregnancy, unspecified trimester: Secondary | ICD-10-CM | POA: Diagnosis present

## 2015-10-28 DIAGNOSIS — O99820 Streptococcus B carrier state complicating pregnancy: Secondary | ICD-10-CM

## 2015-10-28 DIAGNOSIS — Z8759 Personal history of other complications of pregnancy, childbirth and the puerperium: Secondary | ICD-10-CM

## 2015-10-28 DIAGNOSIS — N979 Female infertility, unspecified: Secondary | ICD-10-CM | POA: Diagnosis present

## 2015-10-28 DIAGNOSIS — O134 Gestational [pregnancy-induced] hypertension without significant proteinuria, complicating childbirth: Secondary | ICD-10-CM | POA: Diagnosis present

## 2015-10-28 DIAGNOSIS — O99334 Smoking (tobacco) complicating childbirth: Secondary | ICD-10-CM | POA: Diagnosis present

## 2015-10-28 DIAGNOSIS — Z3A39 39 weeks gestation of pregnancy: Secondary | ICD-10-CM | POA: Diagnosis not present

## 2015-10-28 DIAGNOSIS — Z9889 Other specified postprocedural states: Secondary | ICD-10-CM

## 2015-10-28 HISTORY — DX: Other specified health status: Z78.9

## 2015-10-28 LAB — CBC
HCT: 29.9 % — ABNORMAL LOW (ref 35.0–47.0)
Hemoglobin: 10.1 g/dL — ABNORMAL LOW (ref 12.0–16.0)
MCH: 28.8 pg (ref 26.0–34.0)
MCHC: 33.6 g/dL (ref 32.0–36.0)
MCV: 85.7 fL (ref 80.0–100.0)
PLATELETS: 308 10*3/uL (ref 150–440)
RBC: 3.49 MIL/uL — AB (ref 3.80–5.20)
RDW: 13.7 % (ref 11.5–14.5)
WBC: 18.3 10*3/uL — ABNORMAL HIGH (ref 3.6–11.0)

## 2015-10-28 LAB — RAPID HIV SCREEN (HIV 1/2 AB+AG)
HIV 1/2 ANTIBODIES: NONREACTIVE
HIV-1 P24 ANTIGEN - HIV24: NONREACTIVE

## 2015-10-28 LAB — TYPE AND SCREEN
ABO/RH(D): A POS
Antibody Screen: NEGATIVE

## 2015-10-28 MED ORDER — ACETAMINOPHEN 325 MG PO TABS: 650.0000 mg | ORAL_TABLET | ORAL | Status: DC | PRN

## 2015-10-28 MED ORDER — PANTOPRAZOLE SODIUM 40 MG IV SOLR
40.0000 mg | Freq: Two times a day (BID) | INTRAVENOUS | Status: AC
  Administered 2015-10-28: 40 mg via INTRAVENOUS
  Filled 2015-10-28 (×2): qty 40

## 2015-10-28 MED ORDER — OXYTOCIN BOLUS FROM INFUSION: 500.0000 mL | INTRAVENOUS | Status: DC

## 2015-10-28 MED ORDER — TERBUTALINE SULFATE 1 MG/ML IJ SOLN: 0.2500 mg | Freq: Once | INTRAMUSCULAR | Status: DC | PRN

## 2015-10-28 MED ORDER — ONDANSETRON HCL 4 MG/2ML IJ SOLN: 4.0000 mg | Freq: Four times a day (QID) | INTRAMUSCULAR | Status: DC | PRN

## 2015-10-28 MED ORDER — LACTATED RINGERS IV SOLN
INTRAVENOUS | Status: DC
  Administered 2015-10-28 – 2015-10-29 (×2): via INTRAVENOUS

## 2015-10-28 MED ORDER — ZOLPIDEM TARTRATE 5 MG PO TABS: 5.0000 mg | ORAL_TABLET | Freq: Every evening | ORAL | Status: DC | PRN

## 2015-10-28 MED ORDER — OXYTOCIN 40 UNITS IN LACTATED RINGERS INFUSION - SIMPLE MED
2.5000 [IU]/h | INTRAVENOUS | Status: DC
  Administered 2015-10-29: 1 [IU] via INTRAVENOUS

## 2015-10-28 MED ORDER — BUTORPHANOL TARTRATE 1 MG/ML IJ SOLN
1.0000 mg | INTRAMUSCULAR | Status: DC | PRN
  Administered 2015-10-29: 1 mg via INTRAVENOUS
  Filled 2015-10-28: qty 1

## 2015-10-28 MED ORDER — LACTATED RINGERS IV SOLN: 500.0000 mL | INTRAVENOUS | Status: DC | PRN

## 2015-10-28 MED ORDER — CLINDAMYCIN PHOSPHATE 900 MG/50ML IV SOLN
900.0000 mg | Freq: Three times a day (TID) | INTRAVENOUS | Status: DC
  Administered 2015-10-28 – 2015-10-29 (×3): 900 mg via INTRAVENOUS
  Filled 2015-10-28 (×6): qty 50

## 2015-10-28 MED ORDER — DINOPROSTONE 10 MG VA INST
10.0000 mg | VAGINAL_INSERT | Freq: Once | VAGINAL | Status: AC
  Administered 2015-10-29: 10 mg via VAGINAL
  Filled 2015-10-28: qty 1

## 2015-10-28 NOTE — H&P (Signed)
History and Physical Interval Note:  10/28/2015 8:50 PM  Lisa Adkins  has presented today for INDUCTION OF LABOR (scheduled for 39 weeks),  with the diagnosis of history of precipitous delivery x2 (both out of hospital). The various methods of treatment have been discussed with the patient and family. After consideration of risks, benefits and other options for treatment, the patient has consented to  Labor induction .  The patient's history has been reviewed, patient examined, no change in status, and is stable for induction as planned.  See H&P. I have reviewed the patient's chart and labs.  Questions were answered to the patient's satisfaction.    Letitia Libraobert Paul Orell Hurtado

## 2015-10-28 NOTE — Progress Notes (Signed)
Pt up to BR. Pt on portable monitors.

## 2015-10-29 ENCOUNTER — Inpatient Hospital Stay: Payer: Medicaid Other | Admitting: Certified Registered"

## 2015-10-29 ENCOUNTER — Encounter: Payer: Self-pay | Admitting: *Deleted

## 2015-10-29 DIAGNOSIS — O99340 Other mental disorders complicating pregnancy, unspecified trimester: Secondary | ICD-10-CM

## 2015-10-29 DIAGNOSIS — O99820 Streptococcus B carrier state complicating pregnancy: Secondary | ICD-10-CM

## 2015-10-29 DIAGNOSIS — Z9889 Other specified postprocedural states: Secondary | ICD-10-CM

## 2015-10-29 DIAGNOSIS — O139 Gestational [pregnancy-induced] hypertension without significant proteinuria, unspecified trimester: Secondary | ICD-10-CM | POA: Diagnosis not present

## 2015-10-29 DIAGNOSIS — F329 Major depressive disorder, single episode, unspecified: Secondary | ICD-10-CM | POA: Diagnosis present

## 2015-10-29 DIAGNOSIS — F32A Depression, unspecified: Secondary | ICD-10-CM | POA: Diagnosis present

## 2015-10-29 DIAGNOSIS — O3440 Maternal care for other abnormalities of cervix, unspecified trimester: Secondary | ICD-10-CM | POA: Diagnosis present

## 2015-10-29 LAB — ABO/RH: ABO/RH(D): A POS

## 2015-10-29 MED ORDER — BENZOCAINE-MENTHOL 20-0.5 % EX AERO: 1.0000 "application " | INHALATION_SPRAY | CUTANEOUS | Status: DC | PRN

## 2015-10-29 MED ORDER — OXYTOCIN 10 UNIT/ML IJ SOLN
INTRAMUSCULAR | Status: AC
  Filled 2015-10-29: qty 2

## 2015-10-29 MED ORDER — MISOPROSTOL 200 MCG PO TABS
ORAL_TABLET | ORAL | Status: AC
  Filled 2015-10-29: qty 4

## 2015-10-29 MED ORDER — BUPIVACAINE HCL (PF) 0.25 % IJ SOLN
INTRAMUSCULAR | Status: DC | PRN
  Administered 2015-10-29: 5 mL via EPIDURAL

## 2015-10-29 MED ORDER — SERTRALINE HCL 50 MG PO TABS
50.0000 mg | ORAL_TABLET | Freq: Every day | ORAL | Status: DC
  Filled 2015-10-29 (×2): qty 1

## 2015-10-29 MED ORDER — ONDANSETRON HCL 4 MG/2ML IJ SOLN: 4.0000 mg | INTRAMUSCULAR | Status: DC | PRN

## 2015-10-29 MED ORDER — AMMONIA AROMATIC IN INHA
RESPIRATORY_TRACT | Status: AC
  Filled 2015-10-29: qty 10

## 2015-10-29 MED ORDER — BUTORPHANOL TARTRATE 1 MG/ML IJ SOLN
2.0000 mg | INTRAMUSCULAR | Status: DC | PRN
  Administered 2015-10-29: 2 mg via INTRAVENOUS

## 2015-10-29 MED ORDER — LIDOCAINE HCL (PF) 1 % IJ SOLN
INTRAMUSCULAR | Status: AC
  Filled 2015-10-29: qty 30

## 2015-10-29 MED ORDER — ONDANSETRON HCL 4 MG PO TABS: 4.0000 mg | ORAL_TABLET | ORAL | Status: DC | PRN

## 2015-10-29 MED ORDER — WITCH HAZEL-GLYCERIN EX PADS: 1.0000 "application " | MEDICATED_PAD | CUTANEOUS | Status: DC | PRN

## 2015-10-29 MED ORDER — COCONUT OIL OIL: 1.0000 "application " | TOPICAL_OIL | Status: DC | PRN

## 2015-10-29 MED ORDER — IBUPROFEN 600 MG PO TABS
600.0000 mg | ORAL_TABLET | Freq: Four times a day (QID) | ORAL | Status: DC
  Administered 2015-10-29 – 2015-10-30 (×4): 600 mg via ORAL
  Filled 2015-10-29 (×5): qty 1

## 2015-10-29 MED ORDER — OXYTOCIN 40 UNITS IN LACTATED RINGERS INFUSION - SIMPLE MED
INTRAVENOUS | Status: AC
  Administered 2015-10-29: 1 [IU] via INTRAVENOUS
  Filled 2015-10-29: qty 1000

## 2015-10-29 MED ORDER — DIPHENHYDRAMINE HCL 25 MG PO CAPS: 25.0000 mg | ORAL_CAPSULE | Freq: Four times a day (QID) | ORAL | Status: DC | PRN

## 2015-10-29 MED ORDER — BUTORPHANOL TARTRATE 1 MG/ML IJ SOLN
INTRAMUSCULAR | Status: AC
  Filled 2015-10-29: qty 2

## 2015-10-29 MED ORDER — DOCUSATE SODIUM 100 MG PO CAPS
100.0000 mg | ORAL_CAPSULE | Freq: Two times a day (BID) | ORAL | Status: DC
  Administered 2015-10-29 – 2015-10-31 (×4): 100 mg via ORAL
  Filled 2015-10-29 (×4): qty 1

## 2015-10-29 MED ORDER — LIDOCAINE-EPINEPHRINE (PF) 1.5 %-1:200000 IJ SOLN
INTRAMUSCULAR | Status: DC | PRN
  Administered 2015-10-29: 3 mL via EPIDURAL

## 2015-10-29 MED ORDER — MEDROXYPROGESTERONE ACETATE 150 MG/ML IM SUSP
150.0000 mg | INTRAMUSCULAR | Status: DC | PRN
  Filled 2015-10-29: qty 1

## 2015-10-29 MED ORDER — PRENATAL MULTIVITAMIN CH
1.0000 | ORAL_TABLET | Freq: Every day | ORAL | Status: DC
  Administered 2015-10-31: 1 via ORAL
  Filled 2015-10-29 (×2): qty 1

## 2015-10-29 MED ORDER — FENTANYL 2.5 MCG/ML W/ROPIVACAINE 0.2% IN NS 100 ML EPIDURAL INFUSION (ARMC-ANES)
EPIDURAL | Status: AC
  Administered 2015-10-29: 9 mL/h via EPIDURAL
  Filled 2015-10-29: qty 100

## 2015-10-29 MED ORDER — ACETAMINOPHEN 325 MG PO TABS: 650.0000 mg | ORAL_TABLET | ORAL | Status: DC | PRN

## 2015-10-29 MED ORDER — HYDROCORTISONE ACE-PRAMOXINE 2.5-1 % RE CREA
TOPICAL_CREAM | Freq: Four times a day (QID) | RECTAL | Status: DC
  Administered 2015-10-29 – 2015-10-30 (×4): via RECTAL
  Filled 2015-10-29: qty 30

## 2015-10-29 MED ORDER — ACETAMINOPHEN 500 MG PO TABS
1000.0000 mg | ORAL_TABLET | Freq: Four times a day (QID) | ORAL | Status: DC | PRN
  Filled 2015-10-29: qty 2

## 2015-10-29 MED ORDER — DIBUCAINE 1 % RE OINT: 1.0000 "application " | TOPICAL_OINTMENT | RECTAL | Status: DC | PRN

## 2015-10-29 MED ORDER — SIMETHICONE 80 MG PO CHEW: 80.0000 mg | CHEWABLE_TABLET | ORAL | Status: DC | PRN

## 2015-10-29 NOTE — Discharge Summary (Signed)
Obstetrical Discharge Summary  Patient Name: Lisa Adkins DOB: November 25, 1985 MRN: [id number]  Date of Admission: 10/28/2015 Date of Discharge: 10/31/15  Primary OB: [facility]   Gestational Age at Delivery: 104w0d   Antepartum complications: obesity, history of precipitous delivery Admitting Diagnosis: induction of labor @ term Secondary Diagnosis: Patient Active Problem List   Diagnosis Date Noted  . History of precipitous delivery 10/28/2015  . Obesity affecting pregnancy in third trimester, antepartum 10/28/2015  . 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  She was given cervidil then pitocin, clindamycin for GBS+, and an epidural.  She progressed to complete and had a short second stage.  Delivery was notable for poor respiratory effort in the first minute of life by newborn prompting attention but no intervention from NICU team.  No lacerations. Spontaneous intact placenta. Date of Delivery:  .10/29/15 Delivered By: Leeroy Bock Ward Delivery Type: spontaneous vaginal delivery Anesthesia: epidural Placenta: sponatneous Laceration: none Episiotomy: none Newborn Data: Live born female Birth Weight:  3620 grams APGAR: 3,9     Discharge Physical Exam:  Filed Vitals:   10/30/15 1115 10/30/15 1721 10/30/15 2026 10/31/15 0713  BP: 125/69 124/66 119/63 113/67  Pulse: 79 75 73 83  Temp: 98.4 F (36.9 C) 98.2 F (36.8 C) 97.7 F (36.5 C) 97.6 F (36.4 C)  TempSrc: Oral Oral Oral Oral  Resp: Height:      Weight:      SpO2:   100% 100%   General: NAD CV: RRR Pulm: CTABL, nl effort ABD: s/nd/nt, fundus firm and below the umbilicus Lochia: moderate DVT Evaluation: LE non-ttp, no evidence of DVT on exam. 3+ edema LLE, 2+ edema RLE  HEMOGLOBIN  Date Value Ref Range Status  10/28/2015 10.1* 12.0 - 16.0 g/dL  Final   HGB  Date Value Ref Range Status  02/08/2014 15.1 12.0-16.0 g/dL Final   HCT  Date Value Ref Range Status  10/28/2015 29.9* 35.0 - 47.0 % Final  02/08/2014 44.6 35.0-47.0 % Final    Post partum course: unremarkable Postpartum Procedures: none Disposition: stable, discharge to home. Baby Feeding: formula Baby Disposition: home with mom  Rh Immune globulin given: n/a Rubella vaccine given: n/a Tdap vaccine given in AP or PP setting: AP Flu vaccine given in AP or PP setting: n/a  Contraception: depoprovera then interval BTL.  Prenatal Labs:   A+ RI VI Hep B neg HIV neg RPR NR GBS+   Plan:  BRYANN GENTZ was discharged to home in good condition. Follow-up appointment at [facility] with Dr Elesa Massed in 3 weeks   Discharge Medications:   Medication List    STOP taking these medications        azithromycin 250 MG tablet  Commonly known as:  ZITHROMAX Z-PAK     nitrofurantoin (macrocrystal-monohydrate) 100 MG capsule  Commonly known as:  MACROBID      TAKE these medications        dibucaine 1 % Oint  Commonly known as:  NUPERCAINAL  Place 1 application rectally as needed for hemorrhoids.     ferrous gluconate 324 MG tablet  Commonly known as:  FERGON  Take 1 tablet (324 mg total) by mouth daily with breakfast.     ibuprofen 600 MG tablet  Commonly known as:  ADVIL,MOTRIN  Take 1 tablet (600 mg total) by mouth every 6 (  six) hours.     multivitamin-prenatal 27-0.8 MG Tabs tablet  Take 1 tablet by mouth daily at 12 noon.     oxyCODONE-acetaminophen 5-325 MG tablet  Commonly known as:  PERCOCET/ROXICET  Take 1-2 tablets by mouth every 4 (four) hours as needed for moderate pain or severe pain.     witch hazel-glycerin pad  Commonly known as:  TUCKS  Apply 1 application topically as needed for hemorrhoids.         Marta Antuamara Maxyne Derocher, CNM

## 2015-10-29 NOTE — Progress Notes (Signed)
  Labor Progress Note   30 y.o. [id number] @ 4767w0d , admitted for  Pregnancy, Labor Management. IOL, h/o precipitous labor/delivery  Subjective:  Pain 3-4/10 with her ctxs.  No ROM or VB.  Objective:  BP 136/56 mmHg  Pulse 80  Temp(Src) 98.4 F (36.9 C) (Oral)  Resp 18  Ht 5\' 3"  (1.6 m)  Wt 111.131 kg (245 lb)  BMI 43.41 kg/m2 Abd: mild Extr: trace to 1+ bilateral pedal edema SVE: deferred  EFM: FHR: 140 bpm, variability: moderate,  accelerations:  Present,  decelerations:  Absent Toco: Frequency: Every 5 minutes Labs: I have reviewed the patient's lab results.   Assessment & Plan:  [id number] @ 5467w0d, admitted for  Pregnancy and Labor/Delivery Management  1. Pain management: none. 2. FWB: FHT category 1.  3. ID: GBS positive, Clindamycin given 4. Labor management: Cervadil then Pitocin  All discussed with patient, see orders

## 2015-10-29 NOTE — Anesthesia Procedure Notes (Signed)
Epidural Patient location during procedure: OB Start time: 10/29/2015 2:40 PM End time: 10/29/2015 2:55 PM  Staffing Anesthesiologist: Lisa EdwardsADAMS, JAMES G Resident/CRNA: Lisa ArgyleLOGAN, Lisa Adkins Performed by: resident/CRNA   Preanesthetic Checklist Completed: patient identified, site marked, surgical consent, pre-op evaluation, timeout performed, IV checked, risks and benefits discussed and monitors and equipment checked  Epidural Patient position: sitting Prep: Betadine and site prepped and draped Patient monitoring: heart rate, continuous pulse ox and blood pressure Approach: midline Location: L3-L4 Injection technique: LOR saline  Needle:  Needle type: Tuohy  Needle gauge: 18 G Needle length: 9 cm Needle insertion depth: 9 cm Catheter type: closed end flexible Catheter size: 20 Guage Catheter at skin depth: 13 cm Test dose: negative and 1.5% lidocaine with Epi 1:200 K  Assessment Events: blood not aspirated, injection not painful, no injection resistance, negative IV test and no paresthesia  Additional Notes   Patient tolerated the insertion well without complications.Reason for block:procedure for pain

## 2015-10-29 NOTE — Progress Notes (Addendum)
L&D Progress Note  S: Becoming more uncomfortable, requesting pain medication.  O: BP 122/63 mmHg  Pulse 82  Temp(Src) 98.4 F (36.9 C) (Oral)  Resp 18  Ht 5\' 3"  (1.6 m)  Wt 111.131 kg (245 lb)  BMI 43.41 kg/m2  General : breathing thru contractions FHR: 135 with accelerations to 150s, moderate variability Toco:  q3-5 min apart with some coupling, palpating medium Cervidil removed Cervix: 3/thick 50%/-2/posterior/ vtx EFW: 7 1/2 # Received 2 doses of clindamycin  A: IOL for hx of rapid unattended home births Cervical change with Cervidil FWB: Cat 1 strip  P: Stadol for pain Pitocin augmentation  Lisa Adkins, CNM

## 2015-10-29 NOTE — Anesthesia Preprocedure Evaluation (Signed)
Anesthesia Evaluation  Patient identified by MRN, date of birth, ID band Patient awake    Reviewed: Allergy & Precautions, H&P , NPO status , Patient's Chart, lab work & pertinent test results  History of Anesthesia Complications Negative for: history of anesthetic complications  Airway Mallampati: III  TM Distance: >3 FB Neck ROM: full    Dental  (+) Chipped   Pulmonary Current Smoker,    Pulmonary exam normal        Cardiovascular negative cardio ROS Normal cardiovascular exam     Neuro/Psych negative neurological ROS  negative psych ROS   GI/Hepatic Neg liver ROS, GERD  ,  Endo/Other  negative endocrine ROS  Renal/GU negative Renal ROS  negative genitourinary   Musculoskeletal   Abdominal   Peds  Hematology negative hematology ROS (+)   Anesthesia Other Findings   Reproductive/Obstetrics (+) Pregnancy                             Anesthesia Physical Anesthesia Plan  ASA: II  Anesthesia Plan: Epidural   Post-op Pain Management:    Induction:   Airway Management Planned:   Additional Equipment:   Intra-op Plan:   Post-operative Plan:   Informed Consent: I have reviewed the patients History and Physical, chart, labs and discussed the procedure including the risks, benefits and alternatives for the proposed anesthesia with the patient or authorized representative who has indicated his/her understanding and acceptance.     Plan Discussed with: Anesthesiologist and CRNA  Anesthesia Plan Comments:         Anesthesia Quick Evaluation

## 2015-10-30 LAB — CBC
HCT: 26.2 % — ABNORMAL LOW (ref 35.0–47.0)
Hemoglobin: 8.9 g/dL — ABNORMAL LOW (ref 12.0–16.0)
MCH: 29.5 pg (ref 26.0–34.0)
MCHC: 33.9 g/dL (ref 32.0–36.0)
MCV: 87.1 fL (ref 80.0–100.0)
PLATELETS: 247 10*3/uL (ref 150–440)
RBC: 3.01 MIL/uL — AB (ref 3.80–5.20)
RDW: 13.8 % (ref 11.5–14.5)
WBC: 15.1 10*3/uL — AB (ref 3.6–11.0)

## 2015-10-30 LAB — RPR: RPR: NONREACTIVE

## 2015-10-30 MED ORDER — OXYCODONE-ACETAMINOPHEN 5-325 MG PO TABS
1.0000 | ORAL_TABLET | ORAL | Status: DC | PRN
  Administered 2015-10-30: 2 via ORAL
  Filled 2015-10-30: qty 2

## 2015-10-30 MED ORDER — FERROUS GLUCONATE 324 (38 FE) MG PO TABS
324.0000 mg | ORAL_TABLET | Freq: Two times a day (BID) | ORAL | Status: DC
  Administered 2015-10-30 – 2015-10-31 (×3): 324 mg via ORAL
  Filled 2015-10-30 (×3): qty 1

## 2015-10-30 MED ORDER — MEDROXYPROGESTERONE ACETATE 150 MG/ML IM SUSP: 150.0000 mg | Freq: Once | INTRAMUSCULAR | Status: DC

## 2015-10-30 MED ORDER — IBUPROFEN 600 MG PO TABS
600.0000 mg | ORAL_TABLET | Freq: Four times a day (QID) | ORAL | Status: DC
  Administered 2015-10-31 (×3): 600 mg via ORAL
  Filled 2015-10-30 (×3): qty 1

## 2015-10-30 NOTE — Anesthesia Postprocedure Evaluation (Signed)
Anesthesia Post Note  Patient: Lisa ReadingKristin L Blankenburg  Procedure(s) Performed: * No procedures listed *  Patient location during evaluation: Mother Baby Anesthesia Type: Epidural Level of consciousness: awake and alert Pain management: pain level controlled Vital Signs Assessment: post-procedure vital signs reviewed and stable Respiratory status: spontaneous breathing, nonlabored ventilation and respiratory function stable Cardiovascular status: stable Postop Assessment: no headache, no backache and epidural receding Anesthetic complications: no    Last Vitals:  Filed Vitals:   10/30/15 0550 10/30/15 0712  BP: 101/61 129/81  Pulse: 56 76  Temp: 36.6 C 36.7 C  Resp: 18 20    Last Pain:  Filed Vitals:   10/30/15 0754  PainSc: 0-No pain                 [name],  Randilyn Foisy A

## 2015-10-30 NOTE — Progress Notes (Signed)
Daily Post Partum Note  Lisa Adkins is a 30 y.o. [id number] PPD#1 s/p  SVD/intact perineum  @ [redacted]w[redacted]d  Pregnancy c/b BMI 41, tobacco abuse, depression on zoloft, h/o LEEP  24hr/overnight events:  none  Subjective:  Patient doing well. No s/s of pre-x. Mood good  Objective:   Filed Vitals:   10/30/15 0039 10/30/15 0450 10/30/15 0550 10/30/15 0712  BP: 118/65 105/49 101/61 129/81  Pulse: 85 77 56 76  Temp: 98.3 F (36.8 C) 97.9 F (36.6 C) 97.9 F (36.6 C) 98 F (36.7 C)  TempSrc: Oral Oral Oral Oral  Resp: Height:      Weight:      SpO2:   99% 100%     Current Vital Signs 24h Vital Sign Ranges  T 98 F (36.7 C) Temp  Avg: 98.1 F (36.7 C)  Min: 97.8 F (36.6 C)  Max: 98.4 F (36.9 C)  BP 129/81 mmHg BP  Min: 101/61  Max: 158/61  HR 76 Pulse  Avg: 91.6  Min: 56  Max: 119  RR 20 Resp  Avg: 18.7  Min: 18  Max: 20  SaO2 100 % Not Delivered SpO2  Avg: 99.9 %  Min: 99 %  Max: 100 %       24 Hour I/O Current Shift I/O  Time Ins Outs 05/25 0701 - 05/26 0700 In: -  Out: 200       General: NAD Abdomen: obese, soft, NTTP Perineum: deferred Skin:  Warm and dry.  Cardiovascular: S1, S2 normal, no murmur, rub or gallop, regular rate and rhythm Respiratory:  Clear to auscultation bilateral. Normal respiratory effort Extremities: 1+ edema in LLE b/l, symmetric, nttp  Medications Current Facility-Administered Medications  Medication Dose Route Frequency Provider Last Rate Last Dose  . acetaminophen (TYLENOL) tablet 1,000 mg  1,000 mg Oral Q6H PRN Chelsea C Ward, MD      . acetaminophen (TYLENOL) tablet 650 mg  650 mg Oral Q4H PRN Chelsea C Ward, MD      . benzocaine-Menthol (DERMOPLAST) 20-0.5 % topical spray 1 application  1 application Topical PRN Chelsea C Ward, MD      . butorphanol (STADOL) injection 2 mg  2 mg Intravenous Q2H PRN Farrel Conners, CNM   2 mg at 10/29/15 1045  . coconut oil  1 application Topical PRN Chelsea C Ward, MD      . witch  hazel-glycerin (TUCKS) pad 1 application  1 application Topical PRN Chelsea C Ward, MD       And  . dibucaine (NUPERCAINAL) 1 % rectal ointment 1 application  1 application Rectal PRN Chelsea C Ward, MD      . diphenhydrAMINE (BENADRYL) capsule 25 mg  25 mg Oral Q6H PRN Chelsea C Ward, MD      . docusate sodium (COLACE) capsule 100 mg  100 mg Oral BID Elenora Fender Ward, MD   100 mg at 10/29/15 2137  . hydrocortisone-pramoxine (ANALPRAM-HC) 2.5-1 % rectal cream   Rectal QID Chelsea C Ward, MD      . ibuprofen (ADVIL,MOTRIN) tablet 600 mg  600 mg Oral Q6H Chelsea C Ward, MD   600 mg at 10/30/15 0410  . lactated ringers infusion 500-1,000 mL  500-1,000 mL Intravenous PRN Nadara Mustard, MD      . lactated ringers infusion   Intravenous Continuous Nadara Mustard, MD 125 mL/hr at 10/29/15 [phone]    . medroxyPROGESTERone (DEPO-PROVERA) injection 150 mg  150 mg  Intramuscular Prior to discharge Elenora Fenderhelsea C Ward, MD      . ondansetron [facility](ZOFRAN) injection 4 mg  4 mg Intravenous Q6H PRN Nadara Mustardobert P Harris, MD      . ondansetron [facility](ZOFRAN) tablet 4 mg  4 mg Oral Q4H PRN Chelsea C Ward, MD       Or  . ondansetron (ZOFRAN) injection 4 mg  4 mg Intravenous Q4H PRN Chelsea C Ward, MD      . oxytocin (PITOCIN) IV BOLUS FROM BAG  500 mL Intravenous Continuous Nadara Mustardobert P Harris, MD      . oxytocin (PITOCIN) IV infusion 40 units in LR 1000 mL - Premix  2.5 Units/hr Intravenous Continuous Nadara Mustardobert P Harris, MD 62.5 mL/hr at 10/28/15 2130 5 Units at 10/29/15 1651  . prenatal multivitamin tablet 1 tablet  1 tablet Oral Q1200 Chelsea C Ward, MD      . sertraline (ZOLOFT) tablet 50 mg  50 mg Oral QHS Elenora Fenderhelsea C Ward, MD   50 mg at 10/29/15 2139  . simethicone (MYLICON) chewable tablet 80 mg  80 mg Oral PRN Chelsea C Ward, MD      . terbutaline (BRETHINE) injection 0.25 mg  0.25 mg Subcutaneous Once PRN Nadara Mustardobert P Harris, MD      . zolpidem [facility](AMBIEN) tablet 5 mg  5 mg Oral QHS PRN Nadara Mustardobert P Harris, MD        Labs:   Recent Labs Lab  10/28/15 2116 10/30/15 0628  WBC 18.3* 15.1*  HGB 10.1* 8.9*  HCT 29.9* 26.2*  PLT 308 247    Assessment & Plan:  Pt doing well *Postpartum/postop: routine care. No s/s of anemia.  *GHTN: BPs have looked good PP. I told her may be able to go home late today if BPs still look well, if she desires. Pre-x precautions given *Psych: mood good. Continue zoloft *Dispo: late today/early tomorrow  A POS / Rubella Immune / Varicella Immune/  RPR negative / HIV negative / HepBsAg negative / Tdap UTD: yes/ pap neg, 2016 / Bottle  / Contraception: interval BTL, depo bridge (ordered) / [facility]  Roscoe Witts, Jr. MD [facility] Pager [phone]

## 2015-10-31 MED ORDER — WITCH HAZEL-GLYCERIN EX PADS: 1.0000 "application " | MEDICATED_PAD | CUTANEOUS | Status: DC | PRN

## 2015-10-31 MED ORDER — DIBUCAINE 1 % RE OINT: 1.0000 "application " | TOPICAL_OINTMENT | RECTAL | Status: DC | PRN

## 2015-10-31 MED ORDER — FERROUS GLUCONATE 324 (38 FE) MG PO TABS: 324.0000 mg | ORAL_TABLET | Freq: Every day | ORAL | Status: DC

## 2015-10-31 MED ORDER — OXYCODONE-ACETAMINOPHEN 5-325 MG PO TABS: 1.0000 | ORAL_TABLET | ORAL | Status: DC | PRN

## 2015-10-31 MED ORDER — MEDROXYPROGESTERONE ACETATE 150 MG/ML IM SUSP
150.0000 mg | Freq: Once | INTRAMUSCULAR | Status: AC
  Administered 2015-10-31: 150 mg via INTRAMUSCULAR

## 2015-10-31 MED ORDER — IBUPROFEN 600 MG PO TABS: 600.0000 mg | ORAL_TABLET | Freq: Four times a day (QID) | ORAL | Status: DC

## 2015-10-31 NOTE — Discharge Instructions (Signed)
Discharge instructions:   Call office if you have any of the following: headache, visual changes, fever >101.0 F, chills, breast concerns, excessive vaginal bleeding, incision drainage or problems, leg pain or redness, depression or any other concerns.   Activity: Do not lift > 10 lbs for 6 weeks.  No intercourse or tampons for 6 weeks.  No driving for 1-2 weeks.   Call your doctor for increased pain or vaginal bleeding, temperature above 100.4, depression, or concerns.  No strenuous activity or heavy lifting for 6 weeks.  No intercourse, tampons, douching, or enemas for 6 weeks.  No tub baths-showers only.  No driving for 2 weeks or while taking pain medications.  Continue prenatal vitamin and iron.  Increase calories and fluids while breastfeeding.  You may have a slight fever when your milk comes in, but it should go away on its own.  If it does not, and rises above 101.0 please call the doctor.  For concerns about your baby, please call your pediatrician For breastfeeding concerns, the lactation consultant can be reached at [phone]

## 2015-10-31 NOTE — Progress Notes (Signed)
Prenatal records indicate that pt received TDaP vaccine on 09/07/15. Reynold BowenSusan Paisley Dalila Arca, RN 10/31/2015 10:34 AM

## 2015-11-04 ENCOUNTER — Ambulatory Visit (INDEPENDENT_AMBULATORY_CARE_PROVIDER_SITE_OTHER)
Admission: EM | Admit: 2015-11-04 | Discharge: 2015-11-04 | Disposition: A | Discharge status: Other Acute Inpatient Hospital | Payer: Medicaid Other | Source: Home / Self Care | Attending: Family Medicine | Admitting: Family Medicine

## 2015-11-04 ENCOUNTER — Encounter: Payer: Self-pay | Admitting: Radiology

## 2015-11-04 ENCOUNTER — Inpatient Hospital Stay
Admission: EM | Admit: 2015-11-04 | Discharge: 2015-11-05 | DRG: 776 | Disposition: A | Discharge status: Home / Self Care | Payer: Medicaid Other | Attending: Internal Medicine | Admitting: Internal Medicine

## 2015-11-04 ENCOUNTER — Inpatient Hospital Stay
Admit: 2015-11-04 | Discharge: 2015-11-04 | Disposition: A | Discharge status: Home / Self Care | Payer: Medicaid Other | Attending: Internal Medicine | Admitting: Internal Medicine

## 2015-11-04 ENCOUNTER — Emergency Department: Payer: Medicaid Other

## 2015-11-04 DIAGNOSIS — O135 Gestational [pregnancy-induced] hypertension without significant proteinuria, complicating the puerperium: Secondary | ICD-10-CM | POA: Diagnosis present

## 2015-11-04 DIAGNOSIS — J9601 Acute respiratory failure with hypoxia: Secondary | ICD-10-CM

## 2015-11-04 DIAGNOSIS — R52 Pain, unspecified: Secondary | ICD-10-CM

## 2015-11-04 DIAGNOSIS — R600 Localized edema: Secondary | ICD-10-CM

## 2015-11-04 DIAGNOSIS — R509 Fever, unspecified: Secondary | ICD-10-CM | POA: Diagnosis not present

## 2015-11-04 DIAGNOSIS — F17210 Nicotine dependence, cigarettes, uncomplicated: Secondary | ICD-10-CM | POA: Diagnosis present

## 2015-11-04 DIAGNOSIS — J189 Pneumonia, unspecified organism: Secondary | ICD-10-CM | POA: Diagnosis not present

## 2015-11-04 DIAGNOSIS — O99335 Smoking (tobacco) complicating the puerperium: Secondary | ICD-10-CM | POA: Diagnosis present

## 2015-11-04 DIAGNOSIS — J9600 Acute respiratory failure, unspecified whether with hypoxia or hypercapnia: Secondary | ICD-10-CM | POA: Diagnosis present

## 2015-11-04 DIAGNOSIS — R7989 Other specified abnormal findings of blood chemistry: Secondary | ICD-10-CM

## 2015-11-04 DIAGNOSIS — R059 Cough, unspecified: Secondary | ICD-10-CM

## 2015-11-04 DIAGNOSIS — M7989 Other specified soft tissue disorders: Secondary | ICD-10-CM | POA: Diagnosis present

## 2015-11-04 DIAGNOSIS — R0682 Tachypnea, not elsewhere classified: Secondary | ICD-10-CM | POA: Diagnosis not present

## 2015-11-04 DIAGNOSIS — Z880 Allergy status to penicillin: Secondary | ICD-10-CM | POA: Diagnosis not present

## 2015-11-04 DIAGNOSIS — R609 Edema, unspecified: Secondary | ICD-10-CM

## 2015-11-04 DIAGNOSIS — R05 Cough: Secondary | ICD-10-CM | POA: Diagnosis not present

## 2015-11-04 DIAGNOSIS — O9953 Diseases of the respiratory system complicating the puerperium: Secondary | ICD-10-CM | POA: Diagnosis not present

## 2015-11-04 DIAGNOSIS — R778 Other specified abnormalities of plasma proteins: Secondary | ICD-10-CM

## 2015-11-04 LAB — URINALYSIS COMPLETE WITH MICROSCOPIC (ARMC ONLY)
BILIRUBIN URINE: NEGATIVE
GLUCOSE, UA: NEGATIVE mg/dL
KETONES UR: NEGATIVE mg/dL
NITRITE: NEGATIVE
PH: 7 (ref 5.0–8.0)
Protein, ur: NEGATIVE mg/dL
Specific Gravity, Urine: 1.006 (ref 1.005–1.030)

## 2015-11-04 LAB — BASIC METABOLIC PANEL
Anion gap: 9 (ref 5–15)
BUN: 7 mg/dL (ref 6–20)
CHLORIDE: 103 mmol/L (ref 101–111)
CO2: 24 mmol/L (ref 22–32)
CREATININE: 0.64 mg/dL (ref 0.44–1.00)
Calcium: 8.5 mg/dL — ABNORMAL LOW (ref 8.9–10.3)
GFR calc non Af Amer: >60 mL/min (ref >60)
Glucose, Bld: 89 mg/dL (ref 65–99)
POTASSIUM: 3.7 mmol/L (ref 3.5–5.1)
SODIUM: 136 mmol/L (ref 135–145)

## 2015-11-04 LAB — CBC WITH DIFFERENTIAL/PLATELET
BASOS ABS: 0.1 10*3/uL (ref 0–0.1)
Eosinophils Absolute: 0.1 10*3/uL (ref 0–0.7)
HCT: 30.4 % — ABNORMAL LOW (ref 35.0–47.0)
Hemoglobin: 10.1 g/dL — ABNORMAL LOW (ref 12.0–16.0)
Lymphocytes Relative: 15 %
Lymphs Abs: 2.2 10*3/uL (ref 1.0–3.6)
MCH: 29 pg (ref 26.0–34.0)
MCHC: 33.3 g/dL (ref 32.0–36.0)
MCV: 87 fL (ref 80.0–100.0)
MONO ABS: 0.6 10*3/uL (ref 0.2–0.9)
NEUTROS ABS: 12.3 10*3/uL — AB (ref 1.4–6.5)
Neutrophils Relative %: 79 %
PLATELETS: 322 10*3/uL (ref 150–440)
RBC: 3.49 MIL/uL — ABNORMAL LOW (ref 3.80–5.20)
RDW: 14.7 % — AB (ref 11.5–14.5)
WBC: 15.4 10*3/uL — ABNORMAL HIGH (ref 3.6–11.0)

## 2015-11-04 LAB — ECHOCARDIOGRAM COMPLETE
Height: 63 in
WEIGHTICAEL: 3920 [oz_av]

## 2015-11-04 LAB — BRAIN NATRIURETIC PEPTIDE: B Natriuretic Peptide: 369 pg/mL — ABNORMAL HIGH (ref 0.0–100.0)

## 2015-11-04 LAB — TROPONIN I: TROPONIN I: 0.04 ng/mL — AB (ref <0.031)

## 2015-11-04 MED ORDER — DIBUCAINE 1 % RE OINT
1.0000 "application " | TOPICAL_OINTMENT | RECTAL | Status: DC | PRN
  Filled 2015-11-04: qty 28

## 2015-11-04 MED ORDER — DEXTROSE 5 % IV SOLN
500.0000 mg | Freq: Once | INTRAVENOUS | Status: AC
  Administered 2015-11-04: 500 mg via INTRAVENOUS
  Filled 2015-11-04: qty 500

## 2015-11-04 MED ORDER — PRENATAL MULTIVITAMIN CH
1.0000 | ORAL_TABLET | Freq: Every day | ORAL | Status: DC
  Administered 2015-11-05: 1 via ORAL
  Filled 2015-11-04: qty 1

## 2015-11-04 MED ORDER — WITCH HAZEL-GLYCERIN EX PADS
1.0000 "application " | MEDICATED_PAD | CUTANEOUS | Status: DC | PRN
  Filled 2015-11-04: qty 100

## 2015-11-04 MED ORDER — DEXTROMETHORPHAN-GUAIFENESIN 10-100 MG/5ML PO LIQD
10.0000 mL | Freq: Two times a day (BID) | ORAL | Status: DC | PRN
  Administered 2015-11-04 – 2015-11-05 (×3): 10 mL via ORAL
  Filled 2015-11-04 (×4): qty 10

## 2015-11-04 MED ORDER — IOPAMIDOL (ISOVUE-370) INJECTION 76%
100.0000 mL | Freq: Once | INTRAVENOUS | Status: AC | PRN
  Administered 2015-11-04: 100 mL via INTRAVENOUS

## 2015-11-04 MED ORDER — FERROUS GLUCONATE 324 (38 FE) MG PO TABS
324.0000 mg | ORAL_TABLET | Freq: Every day | ORAL | Status: DC
  Administered 2015-11-05: 324 mg via ORAL
  Filled 2015-11-04 (×3): qty 1

## 2015-11-04 MED ORDER — OXYCODONE-ACETAMINOPHEN 5-325 MG PO TABS
1.0000 | ORAL_TABLET | ORAL | Status: DC | PRN
  Administered 2015-11-05: 1 via ORAL
  Filled 2015-11-04: qty 1

## 2015-11-04 MED ORDER — DEXTROSE 5 % IV SOLN
1.0000 g | INTRAVENOUS | Status: DC
  Filled 2015-11-04: qty 10

## 2015-11-04 MED ORDER — IBUPROFEN 600 MG PO TABS: 600.0000 mg | ORAL_TABLET | Freq: Four times a day (QID) | ORAL | Status: DC | PRN

## 2015-11-04 MED ORDER — DEXTROSE 5 % IV SOLN
250.0000 mg | INTRAVENOUS | Status: DC
  Filled 2015-11-04: qty 250

## 2015-11-04 MED ORDER — DEXTROSE 5 % IV SOLN
1.0000 g | Freq: Once | INTRAVENOUS | Status: AC
  Administered 2015-11-04: 1 g via INTRAVENOUS
  Filled 2015-11-04: qty 10

## 2015-11-04 MED ORDER — ACETAMINOPHEN 325 MG PO TABS
650.0000 mg | ORAL_TABLET | Freq: Four times a day (QID) | ORAL | Status: DC | PRN
  Administered 2015-11-04 – 2015-11-05 (×2): 650 mg via ORAL
  Filled 2015-11-04 (×2): qty 2

## 2015-11-04 MED ORDER — HEPARIN SODIUM (PORCINE) 5000 UNIT/ML IJ SOLN
5000.0000 [IU] | Freq: Three times a day (TID) | INTRAMUSCULAR | Status: DC
  Administered 2015-11-04 – 2015-11-05 (×3): 5000 [IU] via SUBCUTANEOUS
  Filled 2015-11-04 (×3): qty 1

## 2015-11-04 NOTE — ED Notes (Signed)
Pt at CT at this time.

## 2015-11-04 NOTE — ED Notes (Signed)
Patient complains of cough with fever. Patient just recently had a baby 6 days ago. Patient states that she is having headaches and extreme edema. Patient states that she saw OBGYN yesteday and was started on BP medications and HCTZ. Patient states that cough started 2 days ago.

## 2015-11-04 NOTE — Progress Notes (Signed)
Called Dr. Elisabeth PigeonVachhani regarding fever medication for patient.  Doctor put in appropriate orders.  Arturo MortonClay, Alin Chavira N  11/04/2015 8:13 PM

## 2015-11-04 NOTE — H&P (Signed)
[employer] [facility] [place] at [facility]   PATIENT NAME: Lisa Adkins    MR#:  [id number]  DATE OF BIRTH:  Aug 03, 1985  DATE OF ADMISSION:  11/04/2015  PRIMARY CARE PHYSICIAN: Letitia Libra, MD   REQUESTING/REFERRING PHYSICIAN: stafford  CHIEF COMPLAINT:   Chief Complaint  Patient presents with  . Shortness of Breath  . Leg Swelling    HISTORY OF PRESENT ILLNESS: Lisa Adkins  is a 30 y.o. female with a known history of No any medical issues, just delivered her fourth child last week- and in her later phase of this current pregnancy she had high blood pressure issues, and some swelling on her both legs. Her GYN doctors told her swelling and blood pressure would go away after she delivers the baby. She went yesterday to see her GYN doctors after 5 days of delivering the baby and still blood pressure was high so they started on some medicine and she had the swelling on her legs. She started having some cough and feeling very cold and her swelling on the legs was not going away and there was pain in both legs so she decided to come to emergency room today. CT scan of the chest was done to rule out pulmonary embolism and it was negative but it showed bilateral multifocal pneumonia. I confirmed with the patient she is currently not breast-feeding and there is no plans to breast-feed this baby so she can have any safer antibiotics as a routine adult patient.  PAST MEDICAL HISTORY:   Past Medical History  Diagnosis Date  . Medical history non-contributory     PAST SURGICAL HISTORY: Past Surgical History  Procedure Laterality Date  . Cholecystectomy    . Leep  2013    SOCIAL HISTORY:  Social History  Substance Use Topics  . Smoking status: Current Some Day Smoker -- 0.50 packs/day for 4.5 years    Types: Cigarettes  . Smokeless tobacco: Never Used  . Alcohol Use: No    FAMILY HISTORY:  Family History  Problem Relation Age of Onset  . CAD Father   . COPD Father      DRUG ALLERGIES:  Allergies  Allergen Reactions  . Penicillins Anaphylaxis and Other (See Comments)    Has patient had a PCN reaction causing immediate rash, facial/tongue/throat swelling, SOB or lightheadedness with hypotension: Yes Has patient had a PCN reaction causing severe rash involving mucus membranes or skin necrosis: No Has patient had a PCN reaction that required hospitalization No Pt received ceftriaxone without initial reaction (11/04/15) Has patient had a PCN reaction occurring within the last 10 years: No If all of the above answers are "NO", then may proceed with Cephalosporin use.    REVIEW OF SYSTEMS:   CONSTITUTIONAL: No fever, fatigue or weakness.  EYES: No blurred or double vision.  EARS, NOSE, AND THROAT: No tinnitus or ear pain.  RESPIRATORY: positive cough, shortness of breath, wheezing or hemoptysis.  CARDIOVASCULAR: No chest pain, orthopnea, edema.  GASTROINTESTINAL: No nausea, vomiting, diarrhea or abdominal pain.  GENITOURINARY: No dysuria, hematuria.  ENDOCRINE: No polyuria, nocturia,  HEMATOLOGY: No anemia, easy bruising or bleeding SKIN: No rash or lesion. MUSCULOSKELETAL: No joint pain or arthritis.   NEUROLOGIC: No tingling, numbness, weakness.  PSYCHIATRY: No anxiety or depression.   MEDICATIONS AT HOME:  Prior to Admission medications   Medication Sig Start Date End Date Taking? Authorizing Provider  Dextromethorphan-Guaifenesin (MUCINEX FAST-MAX DM MAX) 5-100 MG/5ML LIQD Take 10 mLs by mouth 2 (two)  times daily as needed (for cough/congestion).   Yes Historical Provider, MD  dibucaine (NUPERCAINAL) 1 % OINT Place 1 application rectally as needed for hemorrhoids. 10/31/15  Yes Marta Antu, CNM  ferrous gluconate (FERGON) 324 MG tablet Take 1 tablet (324 mg total) by mouth daily with breakfast. 10/31/15  Yes Marta Antu, CNM  ibuprofen (ADVIL,MOTRIN) 600 MG tablet Take 600 mg by mouth every 6 (six) hours as needed for mild pain.   Yes  Historical Provider, MD  oxyCODONE-acetaminophen (PERCOCET/ROXICET) 5-325 MG tablet Take 1-2 tablets by mouth every 4 (four) hours as needed for moderate pain or severe pain. 10/31/15  Yes Marta Antu, CNM  Prenatal Vit-Fe Fumarate-FA (PRENATAL MULTIVITAMIN) TABS tablet Take 1 tablet by mouth daily at 12 noon.   Yes Historical Provider, MD  witch hazel-glycerin (TUCKS) pad Apply 1 application topically as needed for hemorrhoids. 10/31/15  Yes Marta Antu, CNM      PHYSICAL EXAMINATION:   VITAL SIGNS: Blood pressure 142/67, pulse 89, temperature 99.9 F (37.7 C), temperature source Oral, resp. rate 20, SpO2 100 %, unknown if currently breastfeeding.  GENERAL:  30 y.o.-year-old patient lying in the bed with no acute distress.  EYES: Pupils equal, round, reactive to light and accommodation. No scleral icterus. Extraocular muscles intact.  HEENT: Head atraumatic, normocephalic. Oropharynx and nasopharynx clear.  NECK:  Supple, no jugular venous distention. No thyroid enlargement, no tenderness.  LUNGS: Normal breath sounds bilaterally, no wheezing, some crepitation. No use of accessory muscles of respiration.  CARDIOVASCULAR: S1, S2 normal. No murmurs, rubs, or gallops.  ABDOMEN: Soft, nontender, nondistended. Bowel sounds present. No organomegaly or mass.  EXTREMITIES: Positive bilateral pedal edema, no cyanosis, or clubbing. Positive tenderness on both NEUROLOGIC: Cranial nerves II through XII are intact. Muscle strength 5/5 in all extremities. Sensation intact. Gait not checked.  PSYCHIATRIC: The patient is alert and oriented x 3.  SKIN: No obvious rash, lesion, or ulcer.   LABORATORY PANEL:   CBC  Recent Labs Lab 10/28/15 2116 10/30/15 0628 11/04/15 1517  WBC 18.3* 15.1* 15.4*  HGB 10.1* 8.9* 10.1*  HCT 29.9* 26.2* 30.4*  PLT 308 247 322  MCV 85.7 87.1 87.0  MCH 28.8 29.5 29.0  MCHC 33.6 33.9 33.3  RDW 13.7 13.8 14.7*  LYMPHSABS  --   --  2.2  MONOABS  --   --  0.6   EOSABS  --   --  0.1  BASOSABS  --   --  0.1   ------------------------------------------------------------------------------------------------------------------  Chemistries   Recent Labs Lab 11/04/15 1517  NA 136  K 3.7  CL 103  CO2 24  GLUCOSE 89  BUN 7  CREATININE 0.64  CALCIUM 8.5*   ------------------------------------------------------------------------------------------------------------------ estimated creatinine clearance is 125.4 mL/min (by C-G formula based on Cr of 0.64). ------------------------------------------------------------------------------------------------------------------ No results for input(s): TSH, T4TOTAL, T3FREE, THYROIDAB in the last 72 hours.  Invalid input(s): FREET3   Coagulation profile No results for input(s): INR, PROTIME in the last 168 hours. ------------------------------------------------------------------------------------------------------------------- No results for input(s): DDIMER in the last 72 hours. -------------------------------------------------------------------------------------------------------------------  Cardiac Enzymes  Recent Labs Lab 11/04/15 1517  TROPONINI 0.04*   ------------------------------------------------------------------------------------------------------------------ Invalid input(s): POCBNP  ---------------------------------------------------------------------------------------------------------------  Urinalysis    Component Value Date/Time   COLORURINE STRAW* 11/04/2015 1517   COLORURINE Yellow 12/07/2013 2135   APPEARANCEUR CLEAR* 11/04/2015 1517   APPEARANCEUR Clear 12/07/2013 2135   LABSPEC 1.006 11/04/2015 1517   LABSPEC 1.025 12/07/2013 2135   PHURINE 7.0 11/04/2015 1517   PHURINE 5.0 12/07/2013 2135  GLUCOSEU NEGATIVE 11/04/2015 1517   GLUCOSEU Negative 12/07/2013 2135   HGBUR 2+* 11/04/2015 1517   HGBUR Negative 12/07/2013 2135   BILIRUBINUR NEGATIVE 11/04/2015 1517    BILIRUBINUR Negative 12/07/2013 2135   KETONESUR NEGATIVE 11/04/2015 1517   KETONESUR Negative 12/07/2013 2135   PROTEINUR NEGATIVE 11/04/2015 1517   PROTEINUR Negative 12/07/2013 2135   NITRITE NEGATIVE 11/04/2015 1517   NITRITE Negative 12/07/2013 2135   LEUKOCYTESUR TRACE* 11/04/2015 1517   LEUKOCYTESUR Negative 12/07/2013 2135     RADIOLOGY: Ct Angio Chest Pe W/cm &/or Wo Cm  11/04/2015  CLINICAL DATA:  Cough and fever with shortness of breath two days. Spontaneous vaginal delivery 6 days ago. EXAM: CT ANGIOGRAPHY CHEST WITH CONTRAST TECHNIQUE: Multidetector CT imaging of the chest was performed using the standard protocol during bolus administration of intravenous contrast. Multiplanar CT image reconstructions and MIPs were obtained to evaluate the vascular anatomy. CONTRAST:  100 mL Isovue 370 IV COMPARISON:  Chest x-ray 12/08/2013 FINDINGS: Lungs are adequately inflated with patchy bilateral moderate airspace opacification. Tiny right pleural effusion. Airways are within normal. Mild cardiomegaly. Pulmonary arterial system is normal without evidence of emboli. Thoracic aorta is within normal. 1.2 cm prevascular space lymph node. 1.2 cm right subcarinal lymph node. No significant hilar adenopathy. Remaining mediastinal structures are within normal. Images through the upper abdomen demonstrate no focal abnormality. Remaining bones and soft tissues are within normal. Review of the MIP images confirms the above findings. IMPRESSION: Moderate bilateral patchy airspace consolidation compatible with multifocal pneumonia. Small right pleural effusion. Minimal reactive mediastinal adenopathy. No evidence of pulmonary embolism. Mild cardiomegaly. Electronically Signed   By: Elberta Fortisaniel  Boyle M.D.   On: 11/04/2015 16:35    EKG: Orders placed or performed during the hospital encounter of 11/04/15  . ED EKG  . ED EKG  . EKG 12-Lead  . EKG 12-Lead    IMPRESSION AND PLAN:  * Community-acquired  pneumonia with the acute hypoxic respiratory failure   Patient already received 1 dose of IV ceftriaxone in the ER, she is listing penicillin allergy as anaphylaxis, I confirmed with her and she told me yes.   She received a dose of ceftriaxone in ER almost an hour ago and until now she does not have any reaction or does not feel any distress.   We'll keep watching for any signs of reactions with even second or third dose of ceftriaxone.   I informed pharmacy about this.   As she did not had any reaction I will continue ceftriaxone and azithromycin for her pneumonia   Get sputum culture to better guide antibiotic therapy.  * Bilateral leg swelling and pain   Get ultrasound Doppler study to rule out DVTs.   Get echocardiogram.  * Hypertension   As per patient this was just postpartum, so I would like to watch now.  * Recent postpartum   I will call GYN doctors for follow-up in the hospital. Continue irons, vitamin, pain medications as prescribed by GYN doctor on discharge.    * Smoking    she is a long-term smoker, but she quit smoking during her pregnancy now she is picking it back up and started smoking for last few days.   I counseled in detail to quit smoking completely and she seems to be understanding that and agrees for that I spent 40 minutes for that.   She does not need any nicotine patch and supplements for that.  All the records are reviewed and case discussed with ED  provider. Management plans discussed with the patient, family and they are in agreement.  CODE STATUS: full code  Code Status History    Date Active Date Inactive Code Status Order ID Comments User Context   10/29/2015  6:56 PM 10/31/2015  4:08 PM Full Code [id number]  Elenora Fender Ward, MD Inpatient   10/28/2015  9:18 PM 10/29/2015  6:56 PM Full Code [id number]  Nadara Mustard, MD Inpatient       TOTAL TIME TAKING CARE OF THIS PATIENT: 50 minutes.    Altamese Dilling M.D on 11/04/2015   Between 7am to  6pm - Pager - [phone]  After 6pm go to [email or web address] - password [email or web address]  [facility] [place] [facility]  Office  [phone]  CC: Primary care physician; Letitia Libra, MD   Note: This dictation was prepared with Dragon dictation along with smaller phrase technology. Any transcriptional errors that result from this process are unintentional.

## 2015-11-04 NOTE — ED Notes (Addendum)
MD Desiree HaneVachani paged back, no further orders given at this time other than d/c azithromycin. Pt made aware and verbalized understanding. NAD noted at this time.

## 2015-11-04 NOTE — ED Notes (Signed)
Zitho will be started once rocephin completes.

## 2015-11-04 NOTE — ED Notes (Signed)
Pt given food tray per okay MD Scotty CourtStafford, pt sitting up in bed eating and tolerating well. NAD noted.

## 2015-11-04 NOTE — ED Provider Notes (Signed)
CSN: [id number]     Arrival date & time 11/04/15  1308 History   First MD Initiated Contact with Patient 11/04/15 1415     Chief Complaint  Patient presents with  . Cough   (Consider location/radiation/quality/duration/timing/severity/associated sxs/prior Treatment) HPI: Patient presents today with symptoms of cough and fever. Slight shortness of breath with coughing. Patient states that the symptoms started 2 days ago. Patient had a spontaneous vaginal delivery 6 days ago. She denies any problems with her delivery. She states that she was induced at term. She denies any history of gestational diabetes or preeclampsia. Patient states that she is having mild headaches and lower extremity edema. Her OB/GYN put her on hydrochlorothiazide and lisinopril yesterday. She denies any chest pain or increased vaginal bleeding. She denies any urinary symptoms. Patient is not breast-feeding.  Past Medical History  Diagnosis Date  . Medical history non-contributory    Past Surgical History  Procedure Laterality Date  . Cholecystectomy    . Leep  2013   History reviewed. No pertinent family history. Social History  Substance Use Topics  . Smoking status: Current Every Day Smoker -- 0.50 packs/day for 4.5 years    Types: Cigarettes  . Smokeless tobacco: Never Used  . Alcohol Use: No   OB History    Gravida Para Term Preterm AB TAB SAB Ectopic Multiple Living   5 4 4  1  1   0 4     Review of Systems: Negative except mentioned above.  Allergies  Penicillins  Home Medications   Prior to Admission medications   Medication Sig Start Date End Date Taking? Authorizing Provider  ferrous gluconate (FERGON) 324 MG tablet Take 1 tablet (324 mg total) by mouth daily with breakfast. 10/31/15  Yes Marta Antuamara Brothers, CNM  ibuprofen (ADVIL,MOTRIN) 600 MG tablet Take 1 tablet (600 mg total) by mouth every 6 (six) hours. 10/31/15  Yes Marta Antuamara Brothers, CNM  Prenatal Vit-Fe Fumarate-FA (MULTIVITAMIN-PRENATAL)  27-0.8 MG TABS tablet Take 1 tablet by mouth daily at 12 noon.   Yes Historical Provider, MD  dibucaine (NUPERCAINAL) 1 % OINT Place 1 application rectally as needed for hemorrhoids. 10/31/15   Marta Antuamara Brothers, CNM  oxyCODONE-acetaminophen (PERCOCET/ROXICET) 5-325 MG tablet Take 1-2 tablets by mouth every 4 (four) hours as needed for moderate pain or severe pain. 10/31/15   Marta Antuamara Brothers, CNM  witch hazel-glycerin (TUCKS) pad Apply 1 application topically as needed for hemorrhoids. 10/31/15   Marta Antuamara Brothers, CNM   Meds Ordered and Administered this Visit  Medications - No data to display  Temp(Src) 99.5 F (37.5 C) (Tympanic)  Resp 20  Ht 5\' 3"  (1.6 m)  Wt 245 lb (111.131 kg)  BMI 43.41 kg/m2 No data found.   Physical Exam   GENERAL: NAD HEENT: no pharyngeal erythema, no exudate RESP: tachypnic, CTA B, +accessory muscle use CARD: RRR EXTREM: 2+ pitting edema LEs NEURO: CN II-XII grossly intact    ED Course  Procedures (including critical care time)  Labs Review Labs Reviewed - No data to display  Imaging Review No results found.    MDM  A/P: Fever, cough, tachypnea, postpartum 6 days- recommend further evaluation for PE, pneumonia, etc. Will need labs and imaging. Will have patient transported to ER by EMS. IV Hep-Lock and O2 nasal cannula started.    Jolene ProvostKirtida Katryn Plummer, MD 11/04/15 1425

## 2015-11-04 NOTE — ED Notes (Signed)
MD at bedside. 

## 2015-11-04 NOTE — ED Notes (Signed)
Pt 6 days post-partum. Here for SOB, fever and swelling in feet

## 2015-11-04 NOTE — ED Notes (Signed)
Report given to Lauryn, RN.  

## 2015-11-04 NOTE — ED Provider Notes (Signed)
[facility] Emergency Department Provider Note  ____________________________________________  Time seen: 3:20 PM  I have reviewed the triage vital signs and the nursing notes.   HISTORY  Chief Complaint Shortness of Breath and Leg Swelling    HPI Lisa Adkins is a 30 y.o. female who complains of shortness of breath, cough, and fever for the past 2 days. She recently gave birth 6 days ago. She's had peripheral edema throughout pregnancy, but it did not improve as expected after delivery. Her obstetrician therefore started her on antihypertensives. Patient reports that she gets dyspnea on exertion. No orthopnea. No PND. Denies chest pain. Has fever and sweats.  She is bottle feeding her newborn.     Past Medical History  Diagnosis Date  . Medical history non-contributory      Patient Active Problem List   Diagnosis Date Noted  . Gestational hypertension 10/29/2015  . History of cervical LEEP biopsy affecting care of mother, antepartum 10/29/2015  . Depression affecting pregnancy, antepartum 10/29/2015  . GBS (group B Streptococcus carrier), +RV culture, currently pregnant 10/29/2015  . History of precipitous delivery 10/28/2015  . Obesity affecting pregnancy in third trimester, antepartum 10/28/2015  . Female sterility 10/28/2015     Past Surgical History  Procedure Laterality Date  . Cholecystectomy    . Leep  2013     Current Outpatient Rx  Name  Route  Sig  Dispense  Refill  . dibucaine (NUPERCAINAL) 1 % OINT   Rectal   Place 1 application rectally as needed for hemorrhoids.   1 Tube   2   . ferrous gluconate (FERGON) 324 MG tablet   Oral   Take 1 tablet (324 mg total) by mouth daily with breakfast.   30 tablet   3   . ibuprofen (ADVIL,MOTRIN) 600 MG tablet   Oral   Take 1 tablet (600 mg total) by mouth every 6 (six) hours.   30 tablet   0   . oxyCODONE-acetaminophen (PERCOCET/ROXICET) 5-325 MG tablet   Oral   Take 1-2  tablets by mouth every 4 (four) hours as needed for moderate pain or severe pain.   30 tablet   0   . Prenatal Vit-Fe Fumarate-FA (MULTIVITAMIN-PRENATAL) 27-0.8 MG TABS tablet   Oral   Take 1 tablet by mouth daily at 12 noon.         Marland Kitchen. witch hazel-glycerin (TUCKS) pad   Topical   Apply 1 application topically as needed for hemorrhoids.   40 each   12      Allergies Penicillins   No family history on file.  Social History Social History  Substance Use Topics  . Smoking status: Current Every Day Smoker -- 0.50 packs/day for 4.5 years    Types: Cigarettes  . Smokeless tobacco: Never Used  . Alcohol Use: No    Review of Systems  Constitutional:   Positive fever.  Eyes:   No vision changes.  ENT:   No sore throat. No rhinorrhea. Cardiovascular:   No chest pain. Respiratory:   Positive shortness of breath and nonproductive cough. Gastrointestinal:   Negative for abdominal pain, vomiting and diarrhea.  Genitourinary:   Negative for dysuria or difficulty urinating. Musculoskeletal:   Negative for focal pain , positive peripheral edema, symmetric Neurological:   Negative for headaches 10-point ROS otherwise negative.  ____________________________________________   PHYSICAL EXAM:  VITAL SIGNS: ED Triage Vitals  Enc Vitals Group     BP 11/04/15 1502 138/66 mmHg  Pulse Rate 11/04/15 1502 94     Resp 11/04/15 1502 24     Temp 11/04/15 1502 99.9 F (37.7 C)     Temp Source 11/04/15 1502 Oral     SpO2 11/04/15 1502 88 %     Weight --      Height --      Head Cir --      Peak Flow --      Pain Score 11/04/15 1503 3     Pain Loc --      Pain Edu? --      Excl. in GC? --     Vital signs reviewed, nursing assessments reviewed.   Constitutional:   Alert and oriented. Well appearing and in no distress. Eyes:   No scleral icterus. No conjunctival pallor. PERRL. EOMI.  No nystagmus. ENT   Head:   Normocephalic and atraumatic.   Nose:   No  congestion/rhinnorhea. No septal hematoma   Mouth/Throat:   MMM, no pharyngeal erythema. No peritonsillar mass.    Neck:   No stridor. No SubQ emphysema. No meningismus. Hematological/Lymphatic/Immunilogical:   No cervical lymphadenopathy. Cardiovascular:   Tachycardia heart rate 110. Symmetric bilateral radial and DP pulses.  No murmurs.  Respiratory:   Normal work of breathing, tachypnea and respiratory rate 30-40. Lungs clear to auscultation bilaterally, no wheezes or crackles. Gastrointestinal:   Soft and nontender. Non distended. There is no CVA tenderness.  No rebound, rigidity, or guarding. Genitourinary:   deferred Musculoskeletal:   Nontender with normal range of motion in all extremities. No joint effusions.  No lower extremity tenderness.  1+ pitting edema bilaterally.. Neurologic:   Normal speech and language.  CN 2-10 normal. Motor grossly intact. No gross focal neurologic deficits are appreciated.  Skin:    Skin is warm, dry and intact. No rash noted.  No petechiae, purpura, or bullae.  ____________________________________________    LABS (pertinent positives/negatives) (all labs ordered are listed, but only abnormal results are displayed) Labs Reviewed  TROPONIN I - Abnormal; Notable for the following:    Troponin I 0.04 (*)    All other components within normal limits  CBC WITH DIFFERENTIAL/PLATELET - Abnormal; Notable for the following:    WBC 15.4 (*)    RBC 3.49 (*)    Hemoglobin 10.1 (*)    HCT 30.4 (*)    RDW 14.7 (*)    Neutro Abs 12.3 (*)    All other components within normal limits  URINALYSIS COMPLETEWITH MICROSCOPIC ([facility] ONLY) - Abnormal; Notable for the following:    Color, Urine STRAW (*)    APPearance CLEAR (*)    Hgb urine dipstick 2+ (*)    Leukocytes, UA TRACE (*)    Bacteria, UA RARE (*)    Squamous Epithelial / LPF 0-5 (*)    All other components within normal limits  BRAIN NATRIURETIC PEPTIDE - Abnormal; Notable for the following:     B Natriuretic Peptide 369.0 (*)    All other components within normal limits  BASIC METABOLIC PANEL - Abnormal; Notable for the following:    Calcium 8.5 (*)    All other components within normal limits   ____________________________________________   EKG  Interpreted by me Sinus tachycardia, rate of 101, normal axis intervals QRS ST segments and T waves.  ____________________________________________    RADIOLOGY  CT angiogram chest consistent with bilateral patchy airspace consolidation compatible with multifocal pneumonia. No PE.  ____________________________________________   PROCEDURES CRITICAL CARE Performed by: Scotty Court, Josselyn Harkins  Total critical care time: 35 minutes  Critical care time was exclusive of separately billable procedures and treating other patients.  Critical care was necessary to treat or prevent imminent or life-threatening deterioration.  Critical care was time spent personally by me on the following activities: development of treatment plan with patient and/or surrogate as well as nursing, discussions with consultants, evaluation of patient's response to treatment, examination of patient, obtaining history from patient or surrogate, ordering and performing treatments and interventions, ordering and review of laboratory studies, ordering and review of radiographic studies, pulse oximetry and re-evaluation of patient's condition.   ____________________________________________   INITIAL IMPRESSION / ASSESSMENT AND PLAN / ED COURSE  Pertinent labs & imaging results that were available during my care of the patient were reviewed by me and considered in my medical decision making (see chart for details).  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 Possible pneumonia. 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 However CT scan is negative for PE but positive for multifocal pneumonia. Start cephalexin and azithromycin and discussed the case with the hospitalist for admission. With hypoxia leukocytosis tachycardia and tachypnea, the patient does appear to be mildly septic although it is not severe sepsis, not septic shock. No other organ dysfunction.     ____________________________________________   FINAL CLINICAL IMPRESSION(S) / ED DIAGNOSES  Final diagnoses:  Bilateral pneumonia  Acute respiratory failure with hypoxia (HCC)  Elevated troponin  Peripheral edema  Sepsis     Portions of this note were generated with dragon dictation software. Dictation errors may occur despite best attempts at proofreading.   Sharman Cheek, MD 11/04/15 1902

## 2015-11-04 NOTE — ED Notes (Signed)
EMS called to transport patient to [facility] ED 

## 2015-11-04 NOTE — ED Notes (Addendum)
Pt called out to RN to let RN know that IV burning. Redness noted to IV site, flushed perfectly. Suspected allergic reaction at this time. MD being paged, pt with no other c/c or associated s/s.

## 2015-11-05 ENCOUNTER — Telehealth: Payer: Self-pay | Admitting: Obstetrics and Gynecology

## 2015-11-05 ENCOUNTER — Encounter: Payer: Self-pay | Admitting: *Deleted

## 2015-11-05 ENCOUNTER — Inpatient Hospital Stay: Payer: Medicaid Other

## 2015-11-05 LAB — BASIC METABOLIC PANEL
Anion gap: 6 (ref 5–15)
BUN: 9 mg/dL (ref 6–20)
CHLORIDE: 107 mmol/L (ref 101–111)
CO2: 25 mmol/L (ref 22–32)
Calcium: 8.3 mg/dL — ABNORMAL LOW (ref 8.9–10.3)
Creatinine, Ser: 0.62 mg/dL (ref 0.44–1.00)
GFR calc Af Amer: >60 mL/min (ref >60)
GFR calc non Af Amer: >60 mL/min (ref >60)
Glucose, Bld: 105 mg/dL — ABNORMAL HIGH (ref 65–99)
POTASSIUM: 3.5 mmol/L (ref 3.5–5.1)
SODIUM: 138 mmol/L (ref 135–145)

## 2015-11-05 LAB — CBC
HEMATOCRIT: 28.7 % — AB (ref 35.0–47.0)
Hemoglobin: 9.7 g/dL — ABNORMAL LOW (ref 12.0–16.0)
MCH: 29.3 pg (ref 26.0–34.0)
MCHC: 33.8 g/dL (ref 32.0–36.0)
MCV: 86.7 fL (ref 80.0–100.0)
Platelets: 291 10*3/uL (ref 150–440)
RBC: 3.31 MIL/uL — ABNORMAL LOW (ref 3.80–5.20)
RDW: 14.7 % — AB (ref 11.5–14.5)
WBC: 11.4 10*3/uL — AB (ref 3.6–11.0)

## 2015-11-05 MED ORDER — POTASSIUM CHLORIDE CRYS ER 20 MEQ PO TBCR
40.0000 meq | EXTENDED_RELEASE_TABLET | Freq: Once | ORAL | Status: AC
  Administered 2015-11-05: 40 meq via ORAL
  Filled 2015-11-05: qty 2

## 2015-11-05 MED ORDER — LEVOFLOXACIN 750 MG PO TABS: 750.0000 mg | ORAL_TABLET | Freq: Every day | ORAL | Status: DC

## 2015-11-05 MED ORDER — FUROSEMIDE 10 MG/ML IJ SOLN
40.0000 mg | Freq: Once | INTRAMUSCULAR | Status: AC
  Administered 2015-11-05: 40 mg via INTRAVENOUS
  Filled 2015-11-05: qty 4

## 2015-11-05 NOTE — Care Management (Signed)
Readmission unrelated.  Patient vaginally delivered a baby 6 days ago.  She presented to her OBGYN and started on blood pressure medication.  Presented with shortness of breath and continued swelling of lower extremities.  Had a room air 02 sat of 88% in ED x 1.  CT shows pneumonia.  This admission is not related to previous admission

## 2015-11-05 NOTE — Consult Note (Signed)
Obstetrics & Gynecology Consult H&P    Consulting Department: Medicine  Consulting Physician: Vaughan Basta, MD  Consulting Question: Pneumonia, postpartum   History of Present Illness: Patient is a 30 y.o. [id number] s/p TSVD on 10/29/15 currently admitted for pneumonia.  She started feeling sick on 11/03/15, became progressively worse with URI symptoms, shortness of breath, subjective fevers and chills prompting ER presentation.  She is not breast feeding. Lochia is minimal.  No headaches, vision changes, RUQ or epigastric pain.    Review of Systems:10 point review of systems  Past Medical History:  Past Medical History  Diagnosis Date  . Medical history non-contributory     Past Surgical History:  Past Surgical History  Procedure Laterality Date  . Cholecystectomy    . Leep  2013    Gynecologic History:   Obstetric History: [id number]  Family History:  Family History  Problem Relation Age of Onset  . CAD Father   . COPD Father     Social History:  Social History   Social History  . Marital Status: Single    Spouse Name: N/A  . Number of Children: N/A  . Years of Education: N/A   Occupational History  . Not on file.   Social History Main Topics  . Smoking status: Current Some Day Smoker -- 0.50 packs/day for 4.5 years    Types: Cigarettes  . Smokeless tobacco: Never Used  . Alcohol Use: No  . Drug Use: No  . Sexual Activity: Yes    Birth Control/ Protection: None   Other Topics Concern  . Not on file   Social History Narrative    Allergies:  Allergies  Allergen Reactions  . Penicillins Anaphylaxis and Other (See Comments)    Has patient had a PCN reaction causing immediate rash, facial/tongue/throat swelling, SOB or lightheadedness with hypotension: Yes Has patient had a PCN reaction causing severe rash involving mucus membranes or skin necrosis: No Has patient had a PCN reaction that required hospitalization No Pt received ceftriaxone without  initial reaction (11/04/15) Has patient had a PCN reaction occurring within the last 10 years: No If all of the above answers are "NO", then may proceed with Cephalosporin use.    Medications: Prior to Admission medications   Medication Sig Start Date End Date Taking? Authorizing Provider  Dextromethorphan-Guaifenesin (MUCINEX FAST-MAX DM MAX) 5-100 MG/5ML LIQD Take 10 mLs by mouth 2 (two) times daily as needed (for cough/congestion).   Yes Historical Provider, MD  dibucaine (NUPERCAINAL) 1 % OINT Place 1 application rectally as needed for hemorrhoids. 10/31/15  Yes Burlene Arnt, CNM  ferrous gluconate (FERGON) 324 MG tablet Take 1 tablet (324 mg total) by mouth daily with breakfast. 10/31/15  Yes Burlene Arnt, CNM  ibuprofen (ADVIL,MOTRIN) 600 MG tablet Take 600 mg by mouth every 6 (six) hours as needed for mild pain.   Yes Historical Provider, MD  oxyCODONE-acetaminophen (PERCOCET/ROXICET) 5-325 MG tablet Take 1-2 tablets by mouth every 4 (four) hours as needed for moderate pain or severe pain. 10/31/15  Yes Burlene Arnt, CNM  Prenatal Vit-Fe Fumarate-FA (PRENATAL MULTIVITAMIN) TABS tablet Take 1 tablet by mouth daily at 12 noon.   Yes Historical Provider, MD  witch hazel-glycerin (TUCKS) pad Apply 1 application topically as needed for hemorrhoids. 10/31/15  Yes Burlene Arnt, CNM  levofloxacin (LEVAQUIN) 750 MG tablet Take 1 tablet (750 mg total) by mouth daily. 11/05/15   Hillary Bow, MD    Physical Exam Vitals: Blood pressure 146/73, pulse 96, temperature 98.2 F (36.8  C), temperature source Oral, resp. rate 18, height '5\' 3"'  (1.6 m), weight 109.725 kg (241 lb 14.4 oz), SpO2 99 %, unknown if currently breastfeeding. General: NAD HEENT: normocephalic anicteric Pulmonary: no increased work of breathing Abdomen: soft, non-tender uterus 3cm below umbilicus Genitourinary: deferred Extremities: +2 pitting pretibial edema Neurologic: grossly intact Psychiatric: mood appropriate, affect  full  Labs: Results for orders placed or performed during the hospital encounter of 11/04/15 (from the past 72 hour(s))  Troponin I     Status: Abnormal   Collection Time: 11/04/15  3:17 PM  Result Value Ref Range   Troponin I 0.04 (H) <0.031 ng/mL    Comment: READ BACK AND VERIFIED WITH  LAUREN  CLOUDEN AT 1603 11/04/15 SDR        PERSISTENTLY INCREASED TROPONIN VALUES IN THE RANGE OF 0.04-0.49 ng/mL CAN BE SEEN IN:       -UNSTABLE ANGINA       -CONGESTIVE HEART FAILURE       -MYOCARDITIS       -CHEST TRAUMA       -ARRYHTHMIAS       -LATE PRESENTING MYOCARDIAL INFARCTION       -COPD   CLINICAL FOLLOW-UP RECOMMENDED.   CBC with Differential     Status: Abnormal   Collection Time: 11/04/15  3:17 PM  Result Value Ref Range   WBC 15.4 (H) 3.6 - 11.0 K/uL   RBC 3.49 (L) 3.80 - 5.20 MIL/uL   Hemoglobin 10.1 (L) 12.0 - 16.0 g/dL   HCT 30.4 (L) 35.0 - 47.0 %   MCV 87.0 80.0 - 100.0 fL   MCH 29.0 26.0 - 34.0 pg   MCHC 33.3 32.0 - 36.0 g/dL   RDW 14.7 (H) 11.5 - 14.5 %   Platelets 322 150 - 440 K/uL   Neutrophils Relative % 79% %   Neutro Abs 12.3 (H) 1.4 - 6.5 K/uL   Lymphocytes Relative 15% %   Lymphs Abs 2.2 1.0 - 3.6 K/uL   Monocytes Relative 4% %   Monocytes Absolute 0.6 0.2 - 0.9 K/uL   Eosinophils Relative 1% %   Eosinophils Absolute 0.1 0 - 0.7 K/uL   Basophils Relative 1% %   Basophils Absolute 0.1 0 - 0.1 K/uL  Urinalysis complete, with microscopic     Status: Abnormal   Collection Time: 11/04/15  3:17 PM  Result Value Ref Range   Color, Urine STRAW (A) YELLOW   APPearance CLEAR (A) CLEAR   Glucose, UA NEGATIVE NEGATIVE mg/dL   Bilirubin Urine NEGATIVE NEGATIVE   Ketones, ur NEGATIVE NEGATIVE mg/dL   Specific Gravity, Urine 1.006 1.005 - 1.030   Hgb urine dipstick 2+ (A) NEGATIVE   pH 7.0 5.0 - 8.0   Protein, ur NEGATIVE NEGATIVE mg/dL   Nitrite NEGATIVE NEGATIVE   Leukocytes, UA TRACE (A) NEGATIVE   RBC / HPF 6-30 0 - 5 RBC/hpf   WBC, UA 0-5 0 - 5 WBC/hpf    Bacteria, UA RARE (A) NONE SEEN   Squamous Epithelial / LPF 0-5 (A) NONE SEEN  Brain natriuretic peptide     Status: Abnormal   Collection Time: 11/04/15  3:17 PM  Result Value Ref Range   B Natriuretic Peptide 369.0 (H) 0.0 - 100.0 pg/mL  Basic metabolic panel     Status: Abnormal   Collection Time: 11/04/15  3:17 PM  Result Value Ref Range   Sodium 136 135 - 145 mmol/L   Potassium 3.7 3.5 - 5.1 mmol/L  Comment: HEMOLYSIS AT THIS LEVEL MAY AFFECT RESULT   Chloride 103 101 - 111 mmol/L   CO2 24 22 - 32 mmol/L   Glucose, Bld 89 65 - 99 mg/dL   BUN 7 6 - 20 mg/dL   Creatinine, Ser 0.64 0.44 - 1.00 mg/dL   Calcium 8.5 (L) 8.9 - 10.3 mg/dL   GFR calc non Af Amer >60 >60 mL/min   GFR calc Af Amer >60 >60 mL/min    Comment: (NOTE) The eGFR has been calculated using the CKD EPI equation. This calculation has not been validated in all clinical situations. eGFR's persistently <60 mL/min signify possible Chronic Kidney Disease.    Anion gap 9 5 - 15  Basic metabolic panel     Status: Abnormal   Collection Time: 11/05/15  5:33 AM  Result Value Ref Range   Sodium 138 135 - 145 mmol/L   Potassium 3.5 3.5 - 5.1 mmol/L   Chloride 107 101 - 111 mmol/L   CO2 25 22 - 32 mmol/L   Glucose, Bld 105 (H) 65 - 99 mg/dL   BUN 9 6 - 20 mg/dL   Creatinine, Ser 0.62 0.44 - 1.00 mg/dL   Calcium 8.3 (L) 8.9 - 10.3 mg/dL   GFR calc non Af Amer >60 >60 mL/min   GFR calc Af Amer >60 >60 mL/min    Comment: (NOTE) The eGFR has been calculated using the CKD EPI equation. This calculation has not been validated in all clinical situations. eGFR's persistently <60 mL/min signify possible Chronic Kidney Disease.    Anion gap 6 5 - 15  CBC     Status: Abnormal   Collection Time: 11/05/15  5:33 AM  Result Value Ref Range   WBC 11.4 (H) 3.6 - 11.0 K/uL   RBC 3.31 (L) 3.80 - 5.20 MIL/uL   Hemoglobin 9.7 (L) 12.0 - 16.0 g/dL   HCT 28.7 (L) 35.0 - 47.0 %   MCV 86.7 80.0 - 100.0 fL   MCH 29.3 26.0 -  34.0 pg   MCHC 33.8 32.0 - 36.0 g/dL   RDW 14.7 (H) 11.5 - 14.5 %   Platelets 291 150 - 440 K/uL    Imaging Ct Angio Chest Pe W/cm &/or Wo Cm  11/04/2015  CLINICAL DATA:  Cough and fever with shortness of breath two days. Spontaneous vaginal delivery 6 days ago. EXAM: CT ANGIOGRAPHY CHEST WITH CONTRAST TECHNIQUE: Multidetector CT imaging of the chest was performed using the standard protocol during bolus administration of intravenous contrast. Multiplanar CT image reconstructions and MIPs were obtained to evaluate the vascular anatomy. CONTRAST:  100 mL Isovue 370 IV COMPARISON:  Chest x-ray 12/08/2013 FINDINGS: Lungs are adequately inflated with patchy bilateral moderate airspace opacification. Tiny right pleural effusion. Airways are within normal. Mild cardiomegaly. Pulmonary arterial system is normal without evidence of emboli. Thoracic aorta is within normal. 1.2 cm prevascular space lymph node. 1.2 cm right subcarinal lymph node. No significant hilar adenopathy. Remaining mediastinal structures are within normal. Images through the upper abdomen demonstrate no focal abnormality. Remaining bones and soft tissues are within normal. Review of the MIP images confirms the above findings. IMPRESSION: Moderate bilateral patchy airspace consolidation compatible with multifocal pneumonia. Small right pleural effusion. Minimal reactive mediastinal adenopathy. No evidence of pulmonary embolism. Mild cardiomegaly. Electronically Signed   By: Marin Olp M.D.   On: 11/04/2015 16:35   US Venous Img Lower Bilateral  11/05/2015  CLINICAL DATA:  Pain and swelling x10 months. Seven days postpartum. EXAM:  BILATERAL LOWER EXTREMITY VENOUS DOPPLER ULTRASOUND TECHNIQUE: Gray-scale sonography with compression, as well as color and duplex ultrasound, were performed to evaluate the deep venous system from the level of the common femoral vein through the popliteal and proximal calf veins. COMPARISON:  None FINDINGS: Normal  compressibility of the common femoral, superficial femoral, and popliteal veins, as well as the proximal calf veins. No filling defects to suggest DVT on grayscale or color Doppler imaging. Doppler waveforms show normal direction of venous flow, normal respiratory phasicity and response to augmentation. IMPRESSION: No evidence of  lower extremity deep vein thrombosis, bilaterally. Electronically Signed   By: Lucrezia Europe M.D.   On: 11/05/2015 12:16    Assessment: 30 y.o. [id number] with recent vaginal delivery, gestational hypertension, and PNA  Plan: 1) PNA - as patient is not breast feeding no special consideration need to be taken as to antibiotic selection, antitussives, or inhalers.  If patient is candidate for outpatient management that is acceptable.  2) Gestational hypertension - Goal is systolic BP <403, diastolics <709.  This generally improves throughout the postpartum course and she does not have a prior history of hypertension.  Most data are on methyldopa, labetalol, and nifedipine for BP control, but as patient is not breast feeding any antihypertensives may be suitable although I would avoid agents that may be associated with rebound hypertension as length of treatment is likely going to be short term  3) Edema - likely secondary to postpartum fluid shifts,  Normal echo rule out potential for postpartum cardiomyopathy.  Could consider resending a urine protein to creatinine ratio to see if that patient has developed preeclampsia but would not alter management.  This swelling should improve as blood pressures improve as well and does not necessarily required lasix or other diuretic.    4) Disposition - will arrange outpatient follow up in clinic in the next week for BP check

## 2015-11-05 NOTE — Telephone Encounter (Signed)
[facility] needs a consultation from Dr. Valentino Saxonherry regarding Lisa Adkins. The reason is: recent pregnancy, post-partum. She'd like a phone call regarding this.  Vanity's ph# [phone] Thank you.

## 2015-11-05 NOTE — Telephone Encounter (Signed)
[facility] floor [facility] patient Lisa Adkins's admission.  [facility].  Currently inpatient, being treated for pneumonia.  Informed secretary to notify other practice of patient's admission.

## 2015-11-05 NOTE — Discharge Instructions (Signed)
°  DIET:  °Low salt diet ° °DISCHARGE CONDITION:  °Stable ° °ACTIVITY:  °Activity as tolerated ° °OXYGEN:  °Home Oxygen: No. °  °Oxygen Delivery: room air ° °DISCHARGE LOCATION:  °home  ° °If you experience worsening of your admission symptoms, develop shortness of breath, life threatening emergency, suicidal or homicidal thoughts you must seek medical attention immediately by calling 911 or calling your MD immediately  if symptoms less severe. ° °You Must read complete instructions/literature along with all the possible adverse reactions/side effects for all the Medicines you take and that have been prescribed to you. Take any new Medicines after you have completely understood and accpet all the possible adverse reactions/side effects.  ° °Please note ° °You were cared for by a hospitalist during your hospital stay. If you have any questions about your discharge medications or the care you received while you were in the hospital after you are discharged, you can call the unit and asked to speak with the hospitalist on call if the hospitalist that took care of you is not available. Once you are discharged, your primary care physician will handle any further medical issues. Please note that NO REFILLS for any discharge medications will be authorized once you are discharged, as it is imperative that you return to your primary care physician (or establish a relationship with a primary care physician if you do not have one) for your aftercare needs so that they can reassess your need for medications and monitor your lab values. ° ° °

## 2015-11-05 NOTE — Progress Notes (Signed)
Called Dr. Anne HahnWillis regarding patient wanting to shower.  Doctor agreed as long as patient was steady on her feet.  Arturo MortonClay, Tsering Leaman N  11/05/2015   5:05 AM

## 2015-11-16 NOTE — Discharge Summary (Signed)
[facility] - [place] at [facility]   PATIENT NAME: Lisa Adkins    MR#:  [id number]  DATE OF BIRTH:  [birth year]  DATE OF ADMISSION:  11/04/2015 ADMITTING PHYSICIAN: Altamese Dilling, MD  DATE OF DISCHARGE: 11/05/2015  3:30 PM  PRIMARY CARE PHYSICIAN: Letitia Libra, MD   ADMISSION DIAGNOSIS:  Peripheral edema [R60.9] Pain [R52] Elevated troponin [R79.89] Bilateral pneumonia [J18.9] Acute respiratory failure with hypoxia (HCC) [J96.01]  DISCHARGE DIAGNOSIS:  Active Problems:   Pneumonia   SECONDARY DIAGNOSIS:   Past Medical History  Diagnosis Date  . Medical history non-contributory      ADMITTING HISTORY  HISTORY OF PRESENT ILLNESS: Lisa Adkins is a 30 y.o. female with a known history of No any medical issues, just delivered her fourth child last week- and in her later phase of this current pregnancy she had high blood pressure issues, and some swelling on her both legs. Her GYN doctors told her swelling and blood pressure would go away after she delivers the baby. She went yesterday to see her GYN doctors after 5 days of delivering the baby and still blood pressure was high so they started on some medicine and she had the swelling on her legs. She started having some cough and feeling very cold and her swelling on the legs was not going away and there was pain in both legs so she decided to come to emergency room today. CT scan of the chest was done to rule out pulmonary embolism and it was negative but it showed bilateral multifocal pneumonia. I confirmed with the patient she is currently not breast-feeding and there is no plans to breast-feed this baby so she can have any safer antibiotics as a routine adult patient.  HOSPITAL COURSE:   * Community-acquired pneumonia with the acute hypoxic respiratory failure * B/L LE edema * Hypertension * Smoking  Treated with IV abx. Needed O2 initially but by day of discharge she has no SOB.  SOme dry cough. Ambulated in hallway with sats >92% on RA. For HTN started on meds by her OB physician which is to be continued. B?L edema due to recent pregnancy and fluid shift. Echo normal. NO DVT on dopplers.  Has OP OB f/u.  Discharge home in stable condition  CONSULTS OBTAINED:  Treatment Team:  Hildred Laser, MD  DRUG ALLERGIES:   Allergies  Allergen Reactions  . Penicillins Anaphylaxis and Other (See Comments)    Has patient had a PCN reaction causing immediate rash, facial/tongue/throat swelling, SOB or lightheadedness with hypotension: Yes Has patient had a PCN reaction causing severe rash involving mucus membranes or skin necrosis: No Has patient had a PCN reaction that required hospitalization No Pt received ceftriaxone without initial reaction (11/04/15) Has patient had a PCN reaction occurring within the last 10 years: No If all of the above answers are "NO", then may proceed with Cephalosporin use.    DISCHARGE MEDICATIONS:   Discharge Medication List as of 11/05/2015  2:31 PM    START taking these medications   Details  levofloxacin (LEVAQUIN) 750 MG tablet Take 1 tablet (750 mg total) by mouth daily., Starting 11/05/2015, Until Discontinued, Print      CONTINUE these medications which have NOT CHANGED   Details  Dextromethorphan-Guaifenesin (MUCINEX FAST-MAX DM MAX) 5-100 MG/5ML LIQD Take 10 mLs by mouth 2 (two) times daily as needed (for cough/congestion)., Until Discontinued, Historical Med    dibucaine (NUPERCAINAL) 1 % OINT Place 1 application rectally as needed for  hemorrhoids., Starting 10/31/2015, Until Discontinued, Print    ferrous gluconate (FERGON) 324 MG tablet Take 1 tablet (324 mg total) by mouth daily with breakfast., Starting 10/31/2015, Until Discontinued, Print    ibuprofen (ADVIL,MOTRIN) 600 MG tablet Take 600 mg by mouth every 6 (six) hours as needed for mild pain., Until Discontinued, Historical Med    oxyCODONE-acetaminophen  (PERCOCET/ROXICET) 5-325 MG tablet Take 1-2 tablets by mouth every 4 (four) hours as needed for moderate pain or severe pain., Starting 10/31/2015, Until Discontinued, Print    Prenatal Vit-Fe Fumarate-FA (PRENATAL MULTIVITAMIN) TABS tablet Take 1 tablet by mouth daily at 12 noon., Until Discontinued, Historical Med    witch hazel-glycerin (TUCKS) pad Apply 1 application topically as needed for hemorrhoids., Starting 10/31/2015, Until Discontinued, Print        Today   VITAL SIGNS:  Blood pressure 146/73, pulse 96, temperature 98.2 F (36.8 C), temperature source Oral, resp. rate 18, height 5\' 3"  (1.6 m), weight 109.725 kg (241 lb 14.4 oz), SpO2 99 %, unknown if currently breastfeeding.  I/O:  No intake or output data in the 24 hours ending 11/16/15 1009  PHYSICAL EXAMINATION:  Physical Exam  GENERAL:  30 y.o.-year-old patient lying in the bed with no acute distress.  LUNGS: Normal breath sounds bilaterally, no wheezing, rales,rhonchi or crepitation. No use of accessory muscles of respiration.  CARDIOVASCULAR: S1, S2 normal. No murmurs, rubs, or gallops.  ABDOMEN: Soft, non-tender, non-distended. Bowel sounds present. No organomegaly or mass.  NEUROLOGIC: Moves all 4 extremities. PSYCHIATRIC: The patient is alert and oriented x 3.  SKIN: No obvious rash, lesion, or ulcer.  LE edema  DATA REVIEW:   CBC No results for input(s): WBC, HGB, HCT, PLT in the last 168 hours.  Chemistries  No results for input(s): NA, K, CL, CO2, GLUCOSE, BUN, CREATININE, CALCIUM, MG, AST, ALT, ALKPHOS, BILITOT in the last 168 hours.  Invalid input(s): GFRCGP  Cardiac Enzymes No results for input(s): TROPONINI in the last 168 hours.  Microbiology Results  Results for orders placed or performed during the hospital encounter of 03/13/15  Rapid strep screen     Status: None   Collection Time: 03/13/15 10:24 AM  Result Value Ref Range Status   Streptococcus, Group A Screen (Direct) NEGATIVE NEGATIVE  Final  Culture, group A strep ([facility] only)     Status: None   Collection Time: 03/13/15 10:24 AM  Result Value Ref Range Status   Specimen Description THROAT  Final   Special Requests NONE  Final   Culture   Final    MODERATE GROWTH STREPTOCOCCUS AGALACTIAE Virtually 100% of S. agalactiae (Group B) strains are susceptible to Penicillin.  For Penicillin-allergic patients, Erythromycin (85-95% sensitive) and Clindamycin (80% sensitive) are drugs of choice. Contact microbiology lab to request sensitivities if  needed within 7 days.    Report Status 03/16/2015 FINAL  Final  Urine culture     Status: None   Collection Time: 03/13/15 10:24 AM  Result Value Ref Range Status   Specimen Description URINE, RANDOM  Final   Special Requests Normal  Final   Culture MULTIPLE SPECIES PRESENT, SUGGEST RECOLLECTION  Final   Report Status 03/15/2015 FINAL  Final    RADIOLOGY:  No results found.  Follow up with PCP in 1 week.  Management plans discussed with the patient, family and they are in agreement.  CODE STATUS:  Code Status History    Date Active Date Inactive Code Status Order ID Comments User Context   11/04/2015  7:58 PM 11/05/2015  7:30 PM Full Code [id number]  Altamese DillingVaibhavkumar Vachhani, MD Inpatient   10/29/2015  6:56 PM 10/31/2015  4:08 PM Full Code [id number]  Elenora Fenderhelsea C Ward, MD Inpatient   10/28/2015  9:18 PM 10/29/2015  6:56 PM Full Code [id number]  Nadara Mustardobert P Harris, MD Inpatient    Advance Directive Documentation        Most Recent Value   Type of Advance Directive  Healthcare Power of Attorney   Pre-existing out of facility DNR order (yellow form or pink MOST form)     "MOST" Form in Place?        TOTAL TIME TAKING CARE OF THIS PATIENT ON DAY OF DISCHARGE: more than 30 minutes.   Milagros LollSudini, Kamron Portee R M.D on 11/16/2015 at 10:09 AM  Between 7am to 6pm - Pager - [phone]  After 6pm go to [email or web address] - password [email or web address] [facility]  [place] [place] [facility]  Office   [phone]  CC: Primary care physician; Letitia Libraobert Paul Harris, MD  Note: This dictation was prepared with Dragon dictation along with smaller phrase technology. Any transcriptional errors that result from this process are unintentional.

## 2015-12-02 ENCOUNTER — Encounter: Payer: Self-pay | Admitting: *Deleted

## 2015-12-02 ENCOUNTER — Inpatient Hospital Stay: Admission: RE | Admit: 2015-12-02 | Payer: Medicaid Other | Source: Ambulatory Visit

## 2015-12-02 NOTE — Patient Instructions (Signed)
  Your procedure is scheduled on: 12/11/15 Report to Day Surgery. [facility] SECOND FLOOR To find out your arrival time please call [phone] between 1PM - 3PM on 12/10/15.  Remember: Instructions that are not followed completely may result in serious medical risk, up to and including death, or upon the discretion of your surgeon and anesthesiologist your surgery may need to be rescheduled.    __X__ 1. Do not eat food or drink liquids after midnight. No gum chewing or hard candies.     __X__ 2. No Alcohol for 24 hours before or after surgery.   __X__ 3. Do Not Smoke For 24 Hours Prior to Your Surgery.   ____ 4. Bring all medications with you on the day of surgery if instructed.    __X__ 5. Notify your doctor if there is any change in your medical condition     (cold, fever, infections).       Do not wear jewelry, make-up, hairpins, clips or nail polish.  Do not wear lotions, powders, or perfumes. You may wear deodorant.  Do not shave 48 hours prior to surgery. Men may shave face and neck.  Do not bring valuables to the hospital.    [facility] is not responsible for any belongings or valuables.               Contacts, dentures or bridgework may not be worn into surgery.  Leave your suitcase in the car. After surgery it may be brought to your room.  For patients admitted to the hospital, discharge time is determined by your                treatment team.   Patients discharged the day of surgery will not be allowed to drive home.   Please read over the following fact sheets that you were given:   Surgical Site Infection Prevention   __X__ Take these medicines the morning of surgery with A SIP OF WATER:    1. LABETALOL  2.   3.   4.  5.  6.  ____ Fleet Enema (as directed)   _X___ Use CHG Soap as directed  ____ Use inhalers on the day of surgery  ____ Stop metformin 2 days prior to surgery    ____ Take 1/2 of usual insulin dose the night before surgery and none on the  morning of surgery.   ____ Stop Coumadin/Plavix/aspirin on  ____ Stop Anti-inflammatories on    ____ Stop supplements until after surgery.    ____ Bring C-Pap to the hospital.

## 2015-12-09 ENCOUNTER — Encounter
Admission: RE | Admit: 2015-12-09 | Discharge: 2015-12-09 | Disposition: A | Discharge status: Home / Self Care | Payer: Medicaid Other | Source: Ambulatory Visit | Attending: Obstetrics & Gynecology | Admitting: Obstetrics & Gynecology

## 2015-12-09 DIAGNOSIS — Z01812 Encounter for preprocedural laboratory examination: Secondary | ICD-10-CM | POA: Insufficient documentation

## 2015-12-09 LAB — CBC
HEMATOCRIT: 39.9 % (ref 35.0–47.0)
HEMOGLOBIN: 13.4 g/dL (ref 12.0–16.0)
MCH: 28.8 pg (ref 26.0–34.0)
MCHC: 33.5 g/dL (ref 32.0–36.0)
MCV: 85.8 fL (ref 80.0–100.0)
Platelets: 232 10*3/uL (ref 150–440)
RBC: 4.66 MIL/uL (ref 3.80–5.20)
RDW: 15.9 % — ABNORMAL HIGH (ref 11.5–14.5)
WBC: 11.9 10*3/uL — AB (ref 3.6–11.0)

## 2015-12-09 LAB — TYPE AND SCREEN
ABO/RH(D): A POS
Antibody Screen: NEGATIVE
EXTEND SAMPLE REASON: UNDETERMINED

## 2015-12-09 LAB — POTASSIUM: Potassium: 3.3 mmol/L — ABNORMAL LOW (ref 3.5–5.1)

## 2015-12-11 ENCOUNTER — Encounter
Admission: RE | Disposition: A | Discharge status: Home / Self Care | Payer: Self-pay | Source: Ambulatory Visit | Attending: Obstetrics & Gynecology

## 2015-12-11 ENCOUNTER — Encounter: Payer: Self-pay | Admitting: *Deleted

## 2015-12-11 ENCOUNTER — Ambulatory Visit: Payer: Medicaid Other | Admitting: Certified Registered"

## 2015-12-11 ENCOUNTER — Ambulatory Visit
Admission: RE | Admit: 2015-12-11 | Discharge: 2015-12-11 | Disposition: A | Discharge status: Home / Self Care | Payer: Medicaid Other | Source: Ambulatory Visit | Attending: Obstetrics & Gynecology | Admitting: Obstetrics & Gynecology

## 2015-12-11 DIAGNOSIS — Z9049 Acquired absence of other specified parts of digestive tract: Secondary | ICD-10-CM | POA: Diagnosis not present

## 2015-12-11 DIAGNOSIS — Z302 Encounter for sterilization: Secondary | ICD-10-CM | POA: Diagnosis not present

## 2015-12-11 DIAGNOSIS — Z9889 Other specified postprocedural states: Secondary | ICD-10-CM | POA: Diagnosis not present

## 2015-12-11 DIAGNOSIS — Z79899 Other long term (current) drug therapy: Secondary | ICD-10-CM | POA: Diagnosis not present

## 2015-12-11 DIAGNOSIS — N979 Female infertility, unspecified: Secondary | ICD-10-CM | POA: Diagnosis present

## 2015-12-11 DIAGNOSIS — F329 Major depressive disorder, single episode, unspecified: Secondary | ICD-10-CM | POA: Insufficient documentation

## 2015-12-11 DIAGNOSIS — Z8041 Family history of malignant neoplasm of ovary: Secondary | ICD-10-CM | POA: Insufficient documentation

## 2015-12-11 DIAGNOSIS — Z880 Allergy status to penicillin: Secondary | ICD-10-CM | POA: Diagnosis not present

## 2015-12-11 HISTORY — DX: Essential (primary) hypertension: I10

## 2015-12-11 HISTORY — PX: LAPAROSCOPY: SHX197

## 2015-12-11 HISTORY — PX: LAPAROSCOPIC BILATERAL SALPINGECTOMY: SHX5889

## 2015-12-11 LAB — POCT I-STAT 4, (NA,K, GLUC, HGB,HCT)
GLUCOSE: 94 mg/dL (ref 65–99)
HEMATOCRIT: 41 % (ref 36.0–46.0)
Hemoglobin: 13.9 g/dL (ref 12.0–15.0)
POTASSIUM: 3.4 mmol/L — AB (ref 3.5–5.1)
Sodium: 145 mmol/L (ref 135–145)

## 2015-12-11 LAB — POCT PREGNANCY, URINE: PREG TEST UR: NEGATIVE

## 2015-12-11 SURGERY — LAPAROSCOPY OPERATIVE
Anesthesia: General | Site: Abdomen | Wound class: Clean

## 2015-12-11 MED ORDER — ROCURONIUM BROMIDE 100 MG/10ML IV SOLN
INTRAVENOUS | Status: DC | PRN
  Administered 2015-12-11: 5 mg via INTRAVENOUS
  Administered 2015-12-11: 30 mg via INTRAVENOUS

## 2015-12-11 MED ORDER — PROMETHAZINE HCL 25 MG/ML IJ SOLN
12.5000 mg | Freq: Once | INTRAMUSCULAR | Status: AC
  Administered 2015-12-11: 12.5 mg via INTRAVENOUS

## 2015-12-11 MED ORDER — BUPIVACAINE HCL (PF) 0.5 % IJ SOLN
INTRAMUSCULAR | Status: AC
  Filled 2015-12-11: qty 30

## 2015-12-11 MED ORDER — ACETAMINOPHEN 650 MG RE SUPP: 650.0000 mg | RECTAL | Status: DC | PRN

## 2015-12-11 MED ORDER — MIDAZOLAM HCL 2 MG/2ML IJ SOLN
INTRAMUSCULAR | Status: DC | PRN
  Administered 2015-12-11: 2 mg via INTRAVENOUS

## 2015-12-11 MED ORDER — FENTANYL CITRATE (PF) 100 MCG/2ML IJ SOLN
INTRAMUSCULAR | Status: DC | PRN
  Administered 2015-12-11: 50 ug via INTRAVENOUS
  Administered 2015-12-11: 100 ug via INTRAVENOUS

## 2015-12-11 MED ORDER — PROPOFOL 10 MG/ML IV BOLUS
INTRAVENOUS | Status: DC | PRN
  Administered 2015-12-11: 150 mg via INTRAVENOUS

## 2015-12-11 MED ORDER — GLYCOPYRROLATE 0.2 MG/ML IJ SOLN
INTRAMUSCULAR | Status: DC | PRN
  Administered 2015-12-11: .8 mg via INTRAVENOUS

## 2015-12-11 MED ORDER — MORPHINE SULFATE (PF) 2 MG/ML IV SOLN: 1.0000 mg | INTRAVENOUS | Status: DC | PRN

## 2015-12-11 MED ORDER — LACTATED RINGERS IV SOLN
INTRAVENOUS | Status: DC
  Administered 2015-12-11: 09:00:00 via INTRAVENOUS

## 2015-12-11 MED ORDER — BUPIVACAINE HCL (PF) 0.5 % IJ SOLN
INTRAMUSCULAR | Status: DC | PRN
  Administered 2015-12-11: 10 mL

## 2015-12-11 MED ORDER — KETOROLAC TROMETHAMINE 30 MG/ML IJ SOLN
INTRAMUSCULAR | Status: DC | PRN
  Administered 2015-12-11: 30 mg via INTRAVENOUS

## 2015-12-11 MED ORDER — ONDANSETRON HCL 4 MG/2ML IJ SOLN
INTRAMUSCULAR | Status: DC | PRN
  Administered 2015-12-11: 4 mg via INTRAVENOUS

## 2015-12-11 MED ORDER — ACETAMINOPHEN 325 MG PO TABS: 650.0000 mg | ORAL_TABLET | ORAL | Status: DC | PRN

## 2015-12-11 MED ORDER — OXYCODONE-ACETAMINOPHEN 5-325 MG PO TABS: 1.0000 | ORAL_TABLET | ORAL | Status: DC | PRN

## 2015-12-11 MED ORDER — NEOSTIGMINE METHYLSULFATE 10 MG/10ML IV SOLN
INTRAVENOUS | Status: DC | PRN
  Administered 2015-12-11: 5 mg via INTRAVENOUS

## 2015-12-11 MED ORDER — LIDOCAINE HCL (CARDIAC) 20 MG/ML IV SOLN
INTRAVENOUS | Status: DC | PRN
  Administered 2015-12-11: 100 mg via INTRAVENOUS

## 2015-12-11 MED ORDER — FENTANYL CITRATE (PF) 100 MCG/2ML IJ SOLN
25.0000 ug | INTRAMUSCULAR | Status: DC | PRN
  Administered 2015-12-11 (×2): 25 ug via INTRAVENOUS

## 2015-12-11 MED ORDER — FAMOTIDINE 20 MG PO TABS
20.0000 mg | ORAL_TABLET | Freq: Once | ORAL | Status: AC
  Administered 2015-12-11: 20 mg via ORAL

## 2015-12-11 MED ORDER — PROMETHAZINE HCL 25 MG/ML IJ SOLN
INTRAMUSCULAR | Status: AC
  Administered 2015-12-11: 12.5 mg via INTRAVENOUS
  Filled 2015-12-11: qty 1

## 2015-12-11 MED ORDER — FAMOTIDINE 20 MG PO TABS
ORAL_TABLET | ORAL | Status: AC
Start: 2015-12-11 — End: 2015-12-11
  Administered 2015-12-11: 20 mg via ORAL
  Filled 2015-12-11: qty 1

## 2015-12-11 MED ORDER — OXYCODONE HCL 5 MG PO TABS: 5.0000 mg | ORAL_TABLET | Freq: Once | ORAL | Status: DC | PRN

## 2015-12-11 MED ORDER — KETOROLAC TROMETHAMINE 30 MG/ML IJ SOLN: 30.0000 mg | Freq: Four times a day (QID) | INTRAMUSCULAR | Status: DC

## 2015-12-11 MED ORDER — OXYCODONE HCL 5 MG/5ML PO SOLN: 5.0000 mg | Freq: Once | ORAL | Status: DC | PRN

## 2015-12-11 MED ORDER — SODIUM CHLORIDE 0.9 % IJ SOLN
INTRAMUSCULAR | Status: AC
  Filled 2015-12-11: qty 10

## 2015-12-11 MED ORDER — FENTANYL CITRATE (PF) 100 MCG/2ML IJ SOLN
INTRAMUSCULAR | Status: AC
  Administered 2015-12-11: 25 ug via INTRAVENOUS
  Filled 2015-12-11: qty 2

## 2015-12-11 SURGICAL SUPPLY — 35 items
BLADE SURG SZ11 CARB STEEL (BLADE) ×4 IMPLANT
CANISTER SUCT 1200ML W/VALVE (MISCELLANEOUS) ×4 IMPLANT
CATH ROBINSON RED A/P 16FR (CATHETERS) ×4 IMPLANT
CHLORAPREP W/TINT 26ML (MISCELLANEOUS) ×4 IMPLANT
DRESSING TELFA 4X3 1S ST N-ADH (GAUZE/BANDAGES/DRESSINGS) IMPLANT
ENDOPOUCH RETRIEVER 10 (MISCELLANEOUS) IMPLANT
GAUZE SPONGE NON-WVN 2X2 STRL (MISCELLANEOUS) IMPLANT
GLOVE BIO SURGEON STRL SZ8 (GLOVE) ×4 IMPLANT
GLOVE INDICATOR 8.0 STRL GRN (GLOVE) ×4 IMPLANT
GOWN STRL REUS W/ TWL LRG LVL3 (GOWN DISPOSABLE) ×2 IMPLANT
GOWN STRL REUS W/ TWL XL LVL3 (GOWN DISPOSABLE) ×2 IMPLANT
GOWN STRL REUS W/TWL LRG LVL3 (GOWN DISPOSABLE) ×2
GOWN STRL REUS W/TWL XL LVL3 (GOWN DISPOSABLE) ×2
IRRIGATION STRYKERFLOW (MISCELLANEOUS) ×2 IMPLANT
IRRIGATOR STRYKERFLOW (MISCELLANEOUS) ×4
IV LACTATED RINGERS 1000ML (IV SOLUTION) ×4 IMPLANT
KIT PINK PAD W/HEAD ARE REST (MISCELLANEOUS) ×4
KIT PINK PAD W/HEAD ARM REST (MISCELLANEOUS) ×2 IMPLANT
LABEL OR SOLS (LABEL) ×4 IMPLANT
LIQUID BAND (GAUZE/BANDAGES/DRESSINGS) ×4 IMPLANT
NEEDLE VERESS 14GA 120MM (NEEDLE) ×4 IMPLANT
NS IRRIG 500ML POUR BTL (IV SOLUTION) ×4 IMPLANT
PACK GYN LAPAROSCOPIC (MISCELLANEOUS) ×4 IMPLANT
PAD PREP 24X41 OB/GYN DISP (PERSONAL CARE ITEMS) ×4 IMPLANT
SCISSORS METZENBAUM CVD 33 (INSTRUMENTS) ×4 IMPLANT
SHEARS HARMONIC ACE PLUS 36CM (ENDOMECHANICALS) ×4 IMPLANT
SLEEVE ENDOPATH XCEL 5M (ENDOMECHANICALS) IMPLANT
SPONGE VERSALON 2X2 STRL (MISCELLANEOUS)
STRAP SAFETY BODY (MISCELLANEOUS) ×4 IMPLANT
SUT VIC AB 2-0 UR6 27 (SUTURE) IMPLANT
SUT VIC AB 4-0 PS2 18 (SUTURE) IMPLANT
SYRINGE 10CC LL (SYRINGE) ×4 IMPLANT
TROCAR ENDO BLADELESS 11MM (ENDOMECHANICALS) ×4 IMPLANT
TROCAR XCEL NON-BLD 5MMX100MML (ENDOMECHANICALS) ×4 IMPLANT
TUBING INSUFFLATOR HI FLOW (MISCELLANEOUS) ×4 IMPLANT

## 2015-12-11 NOTE — Op Note (Signed)
  Operative Note   12/11/2015  PRE-OP DIAGNOSIS: Desire for permanent sterilization  POST-OP DIAGNOSIS: same   PROCEDURE: Procedure(s): LAPAROSCOPY OPERATIVE LAPAROSCOPIC BILATERAL SALPINGECTOMY for STERILIZATION  SURGEON: Annamarie MajorPaul Creedon Danielski, MD, FACOG  ANESTHESIA: General   ESTIMATED BLOOD LOSS: <10 mL  COMPLICATIONS: None  DISPOSITION: PACU - hemodynamically stable.  CONDITION: stable  FINDINGS: Laparoscopic survey of the abdomen revealed a grossly normal uterus, tubes, ovaries, liver edge, gallbladder edge and appendix, No intra-abdominal adhesions were noted.  PROCEDURE IN DETAIL: The patient was taken to the OR where anesthesia was administed. The patient was positioned in dorsal lithotomy in the [place] stirrups. The patient was then examined under anesthesia with the above noted findings. The patient was prepped and draped in the normal sterile fashion and foley catheter was placed. A Graves speculum was placed in the vagina and the anterior lip of the cervix was grasped with a single toothed tenaculum. A Hulka uterine manipulator was then inserted in the uterus. Uterine mobility was found to be satisfactory. The speculum was then removed.  Attention was turned to the patient's abdomen where a 11 mm skin incision was made in the umbilical fold, after injection of local anesthesia. The Veress step needle was carefully introduced into the peritoneal cavity with placement confirmed using the hanging drop technique.  Pneumoperitoneum was obtained. The 11 mm port was then placed under direct visualization with the operative laparoscope  The above noted findings.  Trendelenburg.  A 5 mm trocar was then placed in the RLQ lateral to the inferior epigastric blood vessels under direct visualization with the laparoscope.  Right and left fallopian tubes are identified and followed out to their fimbria.  Using the 5mm Harmonic Scapel the fallopian tubes were excised including fimbria by dissecting along  the mesosalpinx and crossing the tube 1 cm from the cornua.  No injuries or bleeding was noted.  All instruments and ports were then removed from the abdomen after gas was ecpelled and patient was leveled.   The skin was closed with skin adhesive. The Hulka tenaculum was removed from the cervix with no bleeding noted from the cervix and all other instrumentation was removed from the vagina. The foley catheter was removed. The patient tolerated the procedure well. All counts were correct x 2. The patient was transferred to the recovery room awake, alert and breathing independently.

## 2015-12-11 NOTE — Transfer of Care (Signed)
Immediate Anesthesia Transfer of Care Note  Patient: Lisa ReadingKristin L Mehaffey  Procedure(s) Performed: Procedure(s): LAPAROSCOPY OPERATIVE (N/A) LAPAROSCOPIC BILATERAL SALPINGECTOMY (Bilateral)  Patient Location: PACU  Anesthesia Type:General  Level of Consciousness: sedated and responds to stimulation  Airway & Oxygen Therapy: Patient Spontanous Breathing and Patient connected to face mask oxygen  Post-op Assessment: Report given to RN and Post -op Vital signs reviewed and stable  Post vital signs: Reviewed and stable  Last Vitals:  Filed Vitals:   12/11/15 0750  BP: 117/64  Pulse: 94  Temp: 36.8 C  Resp: 18    Last Pain:  Filed Vitals:   12/11/15 0751  PainSc: 0-No pain         Complications: No apparent anesthesia complications

## 2015-12-11 NOTE — Anesthesia Preprocedure Evaluation (Signed)
Anesthesia Evaluation  Patient identified by MRN, date of birth, ID band Patient awake    Reviewed: Allergy & Precautions, H&P , NPO status , Patient's Chart, lab work & pertinent test results  History of Anesthesia Complications Negative for: history of anesthetic complications  Airway Mallampati: II  TM Distance: >3 FB Neck ROM: full    Dental  (+) Poor Dentition, Edentulous Upper, Edentulous Lower   Pulmonary neg shortness of breath, pneumonia, resolved, Current Smoker,    Pulmonary exam normal breath sounds clear to auscultation       Cardiovascular Exercise Tolerance: Good hypertension, (-) angina(-) Past MI and (-) DOE negative cardio ROS Normal cardiovascular exam Rhythm:regular Rate:Normal     Neuro/Psych PSYCHIATRIC DISORDERS Depression negative neurological ROS     GI/Hepatic negative GI ROS, Neg liver ROS, neg GERD  ,  Endo/Other  negative endocrine ROS  Renal/GU negative Renal ROS  negative genitourinary   Musculoskeletal   Abdominal   Peds  Hematology negative hematology ROS (+)   Anesthesia Other Findings Past Medical History:   Medical history non-contributory                             Hypertension                                                   Comment:PIH  Past Surgical History:   CHOLECYSTECTOMY                                               LEEP                                             2013         LEEP                                                          NEXPLANON                                                       Comment:REMOVAL  BMI    Body Mass Index   38.98 kg/m 2      Reproductive/Obstetrics negative OB ROS                             Anesthesia Physical Anesthesia Plan  ASA: III  Anesthesia Plan: General ETT   Post-op Pain Management:    Induction:   Airway Management Planned:   Additional Equipment:   Intra-op Plan:    Post-operative Plan:   Informed Consent: I have reviewed the patients History and Physical, chart, labs and discussed the procedure including the risks, benefits and alternatives for  the proposed anesthesia with the patient or authorized representative who has indicated his/her understanding and acceptance.   Dental Advisory Given  Plan Discussed with: Anesthesiologist, CRNA and Surgeon  Anesthesia Plan Comments:         Anesthesia Quick Evaluation

## 2015-12-11 NOTE — H&P (Addendum)
History and Physical Interval Note:  12/11/2015 8:54 AM  Lisa ReadingKristin L Windle  has presented today for surgery, with the diagnosis of DESIRE FOR PERMANENT STERILITY  The various methods of treatment have been discussed with the patient and family. After consideration of risks, benefits and other options for treatment, the patient has consented to  Procedure(s): LAPAROSCOPY OPERATIVE (N/A) LAPAROSCOPIC BILATERAL SALPINGECTOMY (Bilateral) as a surgical intervention .  The patient's history has been reviewed, patient examined, no change in status, stable for surgery.  Pt has the following beta blocker history-  Op Day (Labetalol).  I have reviewed the patient's chart and labs.  Questions were answered to the patient's satisfaction.    Lisa Adkins

## 2015-12-11 NOTE — Anesthesia Procedure Notes (Signed)
Procedure Name: Intubation Performed by: Nayah Lukens Pre-anesthesia Checklist: Patient identified, Patient being monitored, Timeout performed, Emergency Drugs available and Suction available Patient Re-evaluated:Patient Re-evaluated prior to inductionOxygen Delivery Method: Circle system utilized Preoxygenation: Pre-oxygenation with 100% oxygen Intubation Type: IV induction Ventilation: Mask ventilation without difficulty Laryngoscope Size: Mac and 3 Grade View: Grade I Tube type: Oral Tube size: 7.0 mm Number of attempts: 1 Airway Equipment and Method: Stylet Placement Confirmation: ETT inserted through vocal cords under direct vision,  positive ETCO2 and breath sounds checked- equal and bilateral Secured at: 21 cm Tube secured with: Tape Dental Injury: Teeth and Oropharynx as per pre-operative assessment        

## 2015-12-11 NOTE — Anesthesia Postprocedure Evaluation (Signed)
Anesthesia Post Note  Patient: Chauncey ReadingKristin L Vicars  Procedure(s) Performed: Procedure(s) (LRB): LAPAROSCOPY OPERATIVE (N/A) LAPAROSCOPIC BILATERAL SALPINGECTOMY (Bilateral)  Patient location during evaluation: PACU Anesthesia Type: General Level of consciousness: awake and alert Pain management: pain level controlled Vital Signs Assessment: post-procedure vital signs reviewed and stable Respiratory status: spontaneous breathing, nonlabored ventilation, respiratory function stable and patient connected to nasal cannula oxygen Cardiovascular status: blood pressure returned to baseline and stable Postop Assessment: no signs of nausea or vomiting Anesthetic complications: no    Last Vitals:  Filed Vitals:   12/11/15 1115 12/11/15 1123  BP: 124/74 118/75  Pulse: 57 56  Temp:  36.3 C  Resp: 15 17    Last Pain:  Filed Vitals:   12/11/15 1126  PainSc: Asleep                 Cleda MccreedyJoseph K Helene Bernstein

## 2015-12-11 NOTE — Discharge Instructions (Signed)
General Gynecological Post-Operative Instructions °You may expect to feel dizzy, weak, and drowsy for as long as 24 hours after receiving the medicine that made you sleep (anesthetic).  °Do not drive a car, ride a bicycle, participate in physical activities, or take public transportation until you are done taking narcotic pain medicines or as directed by your doctor.  °Do not drink alcohol or take tranquilizers.  °Do not take medicine that has not been prescribed by your doctor.  °Do not sign important papers or make important decisions while on narcotic pain medicines.  °Have a responsible person with you.  °CARE OF INCISION  °Keep incision clean and dry. °Take showers instead of baths until your doctor gives you permission to take baths.  °Avoid heavy lifting (more than 10 pounds/4.5 kilograms), pushing, or pulling.  °Avoid activities that may risk injury to your surgical site.  °No sexual intercourse or placement of anything in the vagina for 1 weeks or as instructed by your doctor. °If you have tubes coming from the wound site, check with your doctor regarding appropriate care of the tubes. °Only take prescription or over-the-counter medicines  for pain, discomfort, or fever as directed by your doctor. Do not take aspirin. It can make you bleed. Take medicines (antibiotics) that kill germs if they are prescribed for you.  °Call the office or go to the ER if:  °You feel sick to your stomach (nauseous) and you start to throw up (vomit).  °You have trouble eating or drinking.  °You have an oral temperature above 101.  °You have constipation that is not helped by adjusting diet or increasing fluid intake. Pain medicines are a common cause of constipation.  °You have any other concerns. °SEEK IMMEDIATE MEDICAL CARE IF:  °You have persistent dizziness.  °You have difficulty breathing or a congested sounding (croupy) cough.  °You have an oral temperature above 102.5, not controlled by medicine.  °There is increasing  pain or tenderness near or in the surgical site.  ° °AMBULATORY SURGERY  °DISCHARGE INSTRUCTIONS ° ° °1) The drugs that you were given will stay in your system until tomorrow so for the next 24 hours you should not: ° °A) Drive an automobile °B) Make any legal decisions °C) Drink any alcoholic beverage ° ° °2) You may resume regular meals tomorrow.  Today it is better to start with liquids and gradually work up to solid foods. ° °You may eat anything you prefer, but it is better to start with liquids, then soup and crackers, and gradually work up to solid foods. ° ° °3) Please notify your doctor immediately if you have any unusual bleeding, trouble breathing, redness and pain at the surgery site, drainage, fever, or pain not relieved by medication. ° ° ° °4) Additional Instructions: ° ° ° ° ° ° ° °Please contact your physician with any problems or Same Day Surgery at [phone], Monday through Friday 6 am to 4 pm, or [place] [facility] [place] Main number at [phone]. ° °

## 2015-12-14 ENCOUNTER — Encounter: Payer: Self-pay | Admitting: Obstetrics & Gynecology

## 2015-12-14 LAB — SURGICAL PATHOLOGY

## 2016-06-16 ENCOUNTER — Ambulatory Visit
Admission: EM | Admit: 2016-06-16 | Discharge: 2016-06-16 | Discharge status: Absent without leave | Payer: Medicaid Other

## 2017-05-26 IMAGING — CT CT ANGIO CHEST
2 of 6 series · 18 of 46 positions shown · IV contrast (APPLIED)
Comparison: Chest x-ray 12/08/2013

CLINICAL DATA: Cough and fever with shortness of breath two days.
Spontaneous vaginal delivery 6 days ago.

EXAM:
CT ANGIOGRAPHY CHEST WITH CONTRAST
TECHNIQUE: Multidetector CT imaging of the chest was performed using the
standard protocol during bolus administration of intravenous
contrast. Multiplanar CT image reconstructions and MIPs were
obtained to evaluate the vascular anatomy.
CONTRAST:  100 mL Isovue 370 IV

[Series 5: pe thins 1.5 · axial · 0.68mm/px · z∈[-72,+177]mm · 15 of 233 slices shown]
[im 13/233  lung]
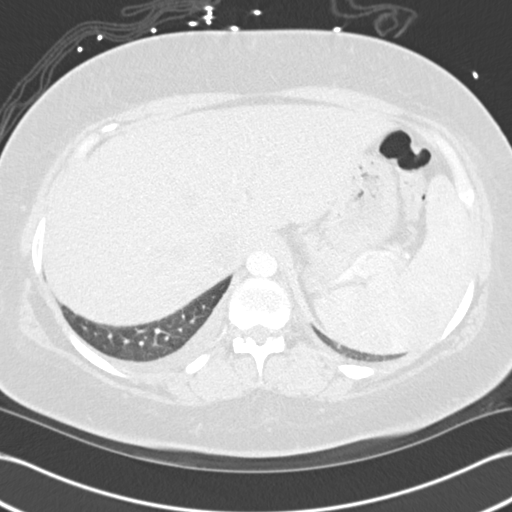
[im 25/233  soft-tissue]
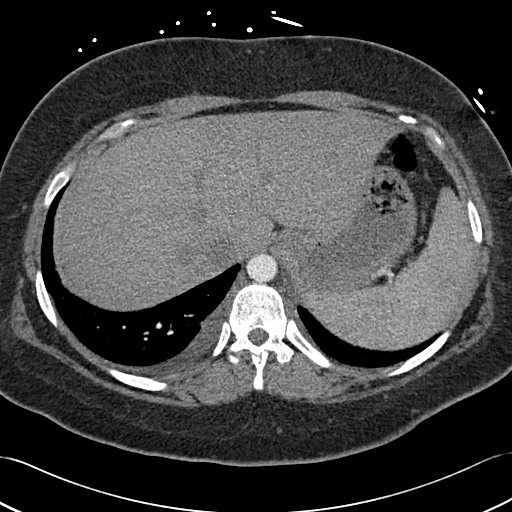
[im 49/233  lung]
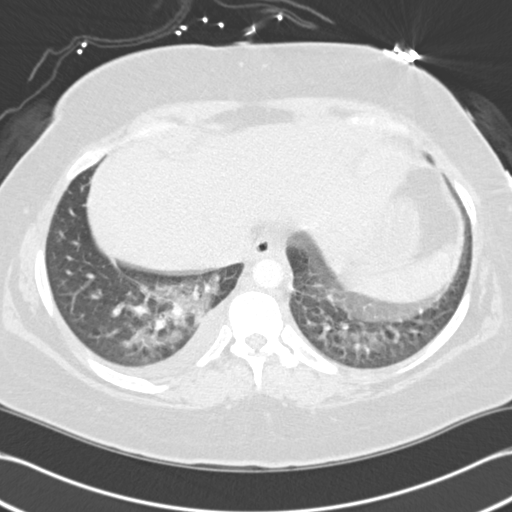
[im 62/233  soft-tissue]
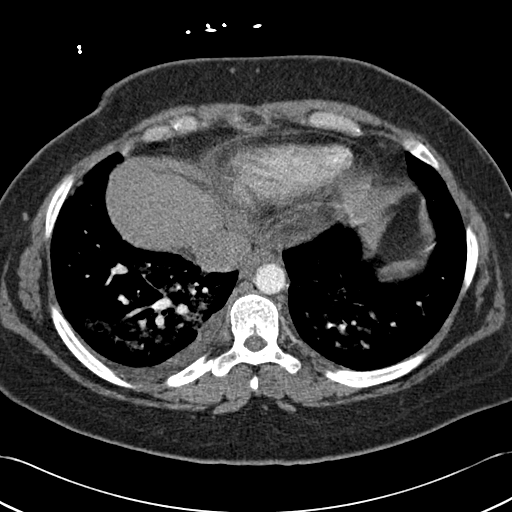
[im 74/233  lung]
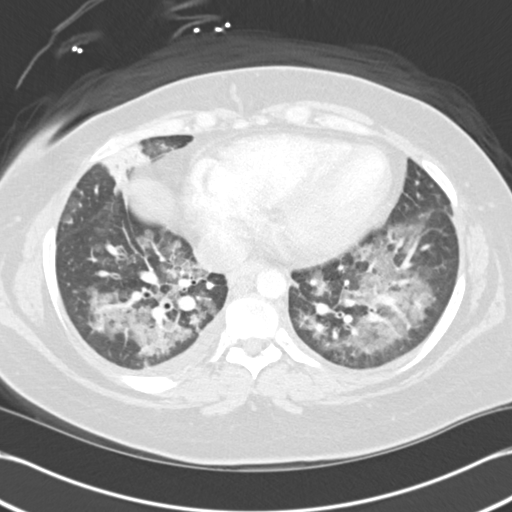
[im 86/233  soft-tissue]
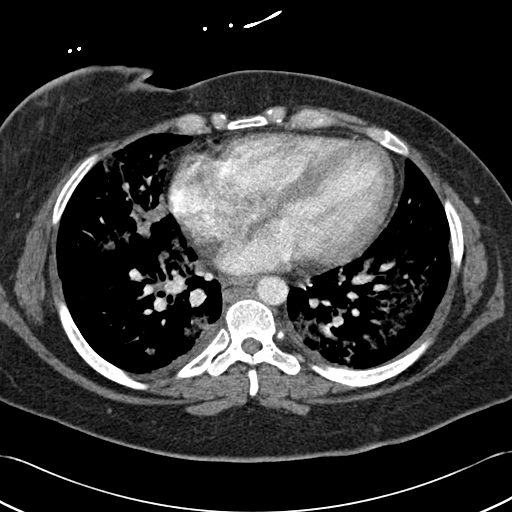
[im 98/233  lung]
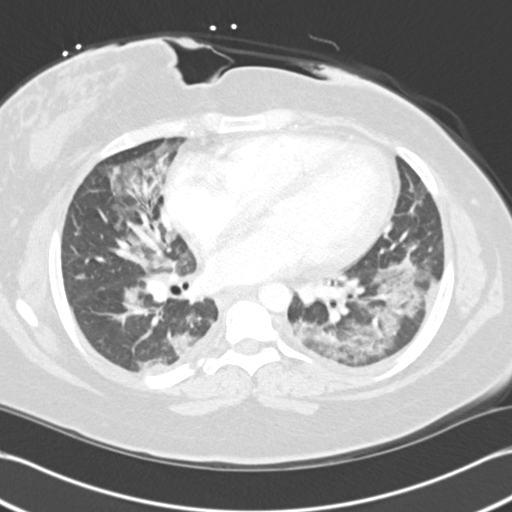
[im 123/233  soft-tissue]
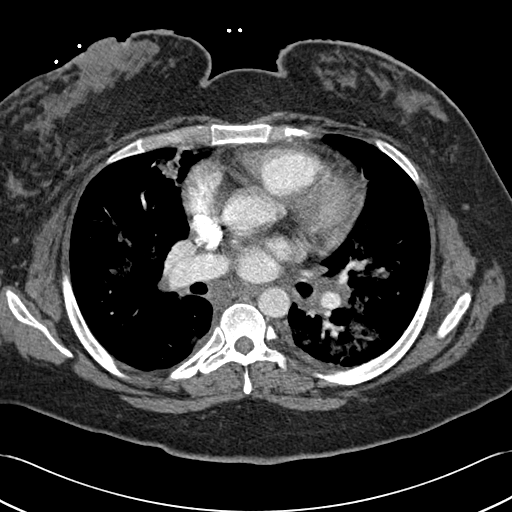
[im 135/233  lung]
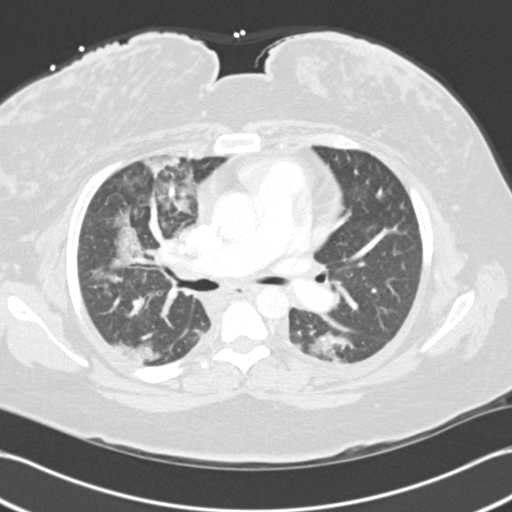
[im 147/233  soft-tissue]
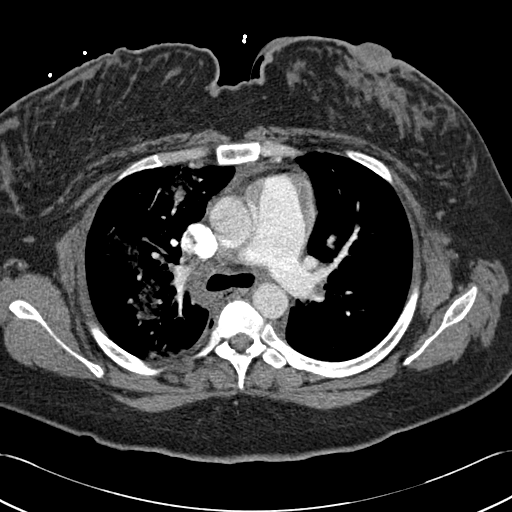
[im 159/233  lung]
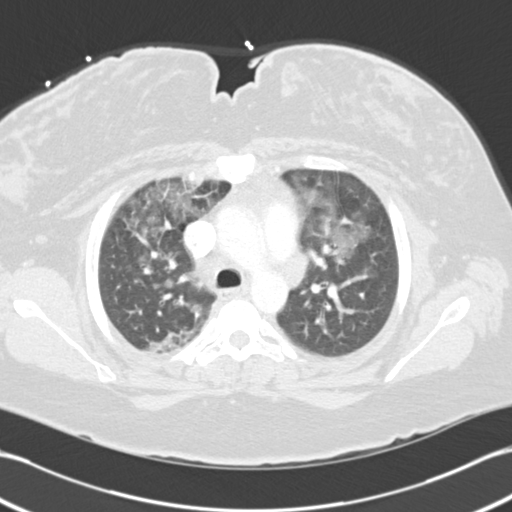
[im 171/233  soft-tissue]
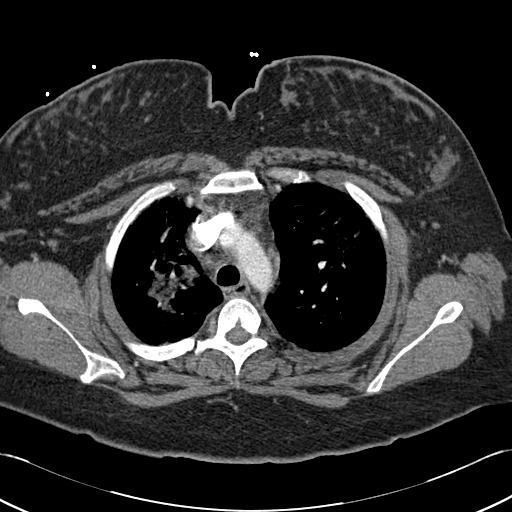
[im 196/233  lung]
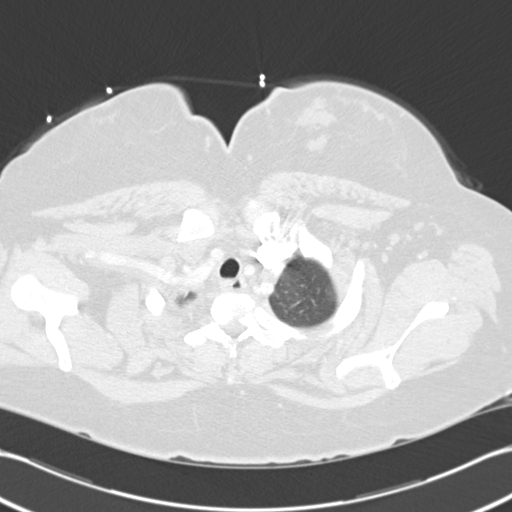
[im 208/233  soft-tissue]
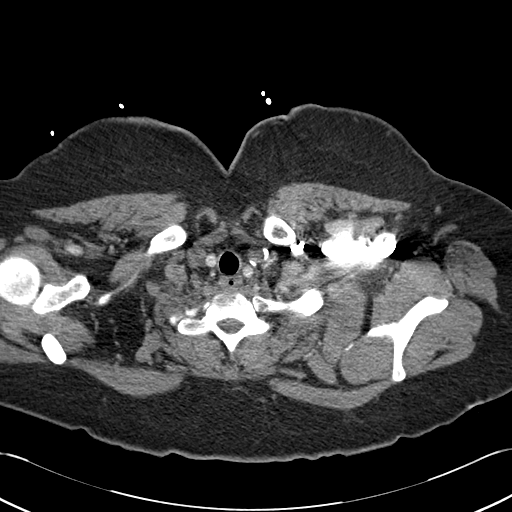
[im 220/233  lung]
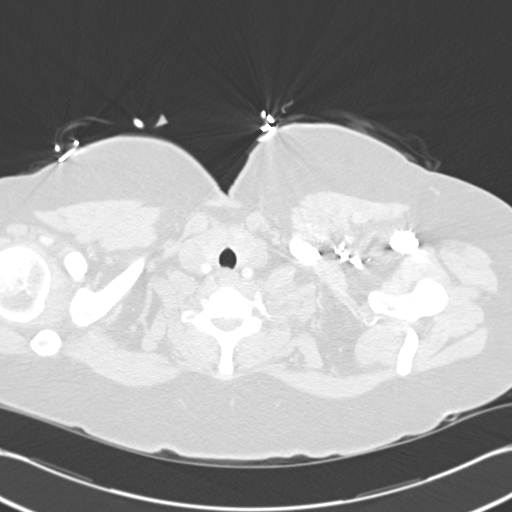

[Series 7: cor mpr 2.0 · coronal · 0.55mm/px · 3 of 145 slices shown]
[im 37/145  soft-tissue]
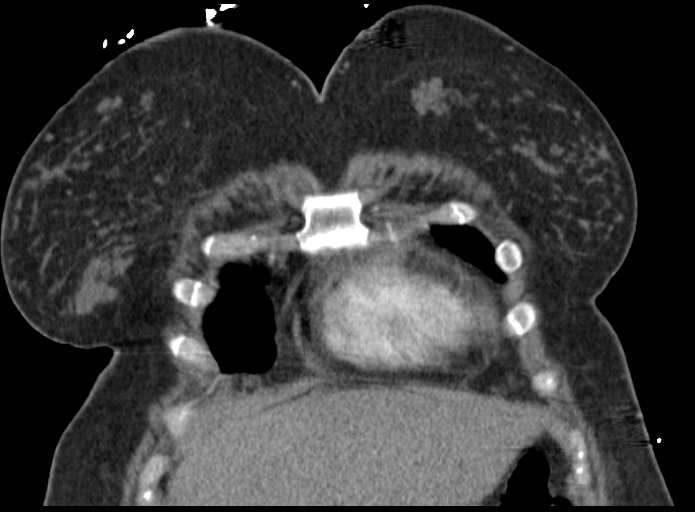
[im 73/145  soft-tissue]
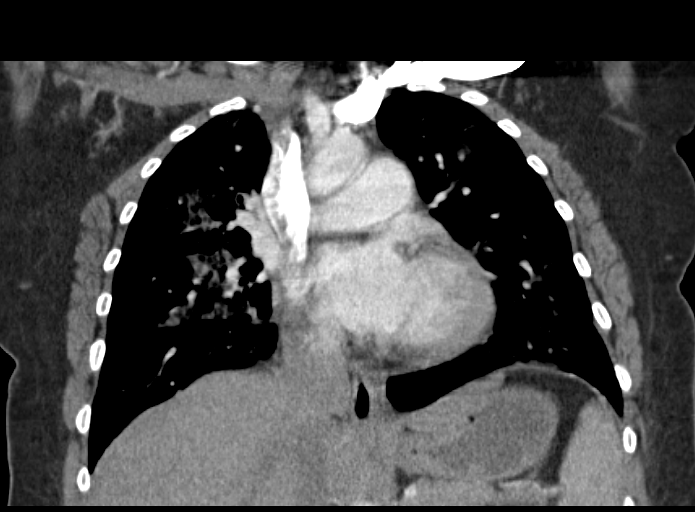
[im 109/145  soft-tissue]
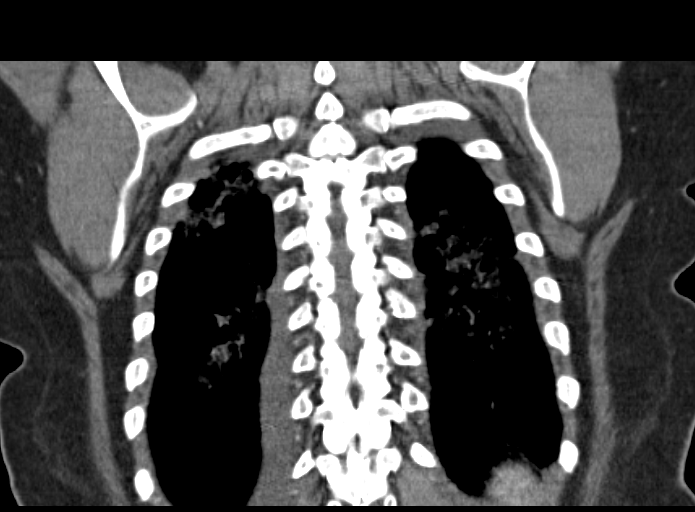

[18 of 46 positions shown; findings below may reference images not displayed]

FINDINGS: Lungs are adequately inflated with patchy bilateral moderate
airspace opacification. Tiny right pleural effusion. Airways are
within normal.

Mild cardiomegaly. Pulmonary arterial system is normal without
evidence of emboli. Thoracic aorta is within normal. 1.2 cm
prevascular space lymph node. 1.2 cm right subcarinal lymph node. No
significant hilar adenopathy. Remaining mediastinal structures are
within normal.

Images through the upper abdomen demonstrate no focal abnormality.
Remaining bones and soft tissues are within normal.

Review of the MIP images confirms the above findings.
IMPRESSION: Moderate bilateral patchy airspace consolidation compatible with
multifocal pneumonia. Small right pleural effusion. Minimal reactive
mediastinal adenopathy.

No evidence of pulmonary embolism.

Mild cardiomegaly.

## 2017-10-10 ENCOUNTER — Other Ambulatory Visit: Payer: Self-pay

## 2017-10-10 ENCOUNTER — Ambulatory Visit
Admission: EM | Admit: 2017-10-10 | Discharge: 2017-10-10 | Disposition: A | Discharge status: Home / Self Care | Payer: Medicaid Other | Attending: Family Medicine | Admitting: Family Medicine

## 2017-10-10 DIAGNOSIS — Z880 Allergy status to penicillin: Secondary | ICD-10-CM | POA: Diagnosis not present

## 2017-10-10 DIAGNOSIS — Z79899 Other long term (current) drug therapy: Secondary | ICD-10-CM | POA: Insufficient documentation

## 2017-10-10 DIAGNOSIS — F17210 Nicotine dependence, cigarettes, uncomplicated: Secondary | ICD-10-CM | POA: Insufficient documentation

## 2017-10-10 DIAGNOSIS — J020 Streptococcal pharyngitis: Secondary | ICD-10-CM | POA: Diagnosis not present

## 2017-10-10 DIAGNOSIS — J029 Acute pharyngitis, unspecified: Secondary | ICD-10-CM | POA: Diagnosis present

## 2017-10-10 LAB — RAPID STREP SCREEN (MED CTR MEBANE ONLY): STREPTOCOCCUS, GROUP A SCREEN (DIRECT): POSITIVE — AB

## 2017-10-10 MED ORDER — LIDOCAINE VISCOUS 2 % MT SOLN: OROMUCOSAL | 0 refills | Status: DC

## 2017-10-10 MED ORDER — AZITHROMYCIN 250 MG PO TABS: ORAL_TABLET | ORAL | 0 refills | Status: DC

## 2017-10-10 NOTE — ED Triage Notes (Signed)
Patient complains of sore throat with painful swallowing, neck swelling x yesterday.

## 2017-10-10 NOTE — ED Provider Notes (Signed)
[facility]    CSN: [id number] Arrival date & time: 10/10/17  1519     History   Chief Complaint Chief Complaint  Patient presents with  . Sore Throat    HPI Lisa Adkins is a 32 y.o. female.   The history is provided by the patient.  Sore Throat  This is a new problem. The current episode started yesterday. The problem occurs constantly. The problem has been gradually worsening. Pertinent negatives include no chest pain, no abdominal pain, no headaches and no shortness of breath. She has tried nothing for the symptoms.    Past Medical History:  Diagnosis Date  . Hypertension    PIH  . Medical history non-contributory     Patient Active Problem List   Diagnosis Date Noted  . Pneumonia 11/04/2015  . Gestational hypertension 10/29/2015  . History of cervical LEEP biopsy affecting care of mother, antepartum 10/29/2015  . Depression affecting pregnancy, antepartum 10/29/2015  . GBS (group B Streptococcus carrier), +RV culture, currently pregnant 10/29/2015  . History of precipitous delivery 10/28/2015  . Obesity affecting pregnancy in third trimester, antepartum 10/28/2015  . Female sterility 10/28/2015    Past Surgical History:  Procedure Laterality Date  . CHOLECYSTECTOMY    . LAPAROSCOPIC BILATERAL SALPINGECTOMY Bilateral 12/11/2015   Procedure: LAPAROSCOPIC BILATERAL SALPINGECTOMY;  Surgeon: Nadara Mustard, MD;  Location: [facility];  Service: Gynecology;  Laterality: Bilateral;  . LAPAROSCOPY N/A 12/11/2015   Procedure: LAPAROSCOPY OPERATIVE;  Surgeon: Nadara Mustard, MD;  Location: [facility];  Service: Gynecology;  Laterality: N/A;  . LEEP  2013  . LEEP    . NEXPLANON     REMOVAL    OB History    Gravida  5   Para  4   Term  4   Preterm      AB  1   Living  4     SAB  1   TAB      Ectopic      Multiple  0   Live Births  4            Home Medications    Prior to Admission medications   Medication Sig Start Date End Date  Taking? Authorizing Provider  amphetamine-dextroamphetamine (ADDERALL) 20 MG tablet Take by mouth. 08/29/17  Yes [provider]  DULoxetine (CYMBALTA) 30 MG capsule Take by mouth daily. 07/26/17  Yes [provider]  azithromycin (ZITHROMAX Z-PAK) 250 MG tablet 2 tabs po once day 1, then 1 tab po qd x 4 days 10/10/17   Payton Mccallum, MD  hydrochlorothiazide (HYDRODIURIL) 25 MG tablet Take 25 mg by mouth daily. Reported on 12/11/2015    [provider]  ibuprofen (ADVIL,MOTRIN) 600 MG tablet Take 600 mg by mouth every 6 (six) hours as needed for mild pain.    [provider]  labetalol (NORMODYNE) 100 MG tablet Take 100 mg by mouth 2 (two) times daily.    [provider]  lidocaine (XYLOCAINE) 2 % solution 20 ml gargle and spit q 6 hours 10/10/17   Payton Mccallum, MD  oxyCODONE-acetaminophen (PERCOCET) 5-325 MG tablet Take 1 tablet by mouth every 4 (four) hours as needed for moderate pain or severe pain. 12/11/15   Nadara Mustard, MD    Family History Family History  Problem Relation Age of Onset  . CAD Father   . COPD Father     Social History Social History   Tobacco Use  . Smoking status:  Current Some Day Smoker    Packs/day: 0.50    Years: 4.50    Pack years: 2.25    Types: Cigarettes  . Smokeless tobacco: Never Used  Substance Use Topics  . Alcohol use: No    Alcohol/week: 0.0 oz  . Drug use: No     Allergies   Penicillins and Other   Review of Systems Review of Systems  Respiratory: Negative for shortness of breath.   Cardiovascular: Negative for chest pain.  Gastrointestinal: Negative for abdominal pain.  Neurological: Negative for headaches.     Physical Exam Triage Vital Signs ED Triage Vitals  Enc Vitals Group     BP 10/10/17 1528 136/90     Pulse Rate 10/10/17 1528 (!) 114     Resp 10/10/17 1528 17     Temp 10/10/17 1528 98.6 F (37 C)     Temp Source 10/10/17 1528 Oral     SpO2 10/10/17 1528 100 %     Weight  10/10/17 1526 185 lb (83.9 kg)     Height 10/10/17 1526  (1.6 m)     Head Circumference --      Peak Flow --      Pain Score 10/10/17 1526 8     Pain Loc --      Pain Edu? --      Excl. in GC? --    No data found.  Updated Vital Signs BP 136/90 (BP Location: Left Arm)   Pulse (!) 114   Temp 98.6 F (37 C) (Oral)   Resp 17   Ht  (1.6 m)   Wt 185 lb (83.9 kg)   LMP 09/19/2017   SpO2 100%   Breastfeeding? No   BMI 32.77 kg/m   Visual Acuity Right Eye Distance:   Left Eye Distance:   Bilateral Distance:    Right Eye Near:   Left Eye Near:    Bilateral Near:     Physical Exam  Constitutional: She appears well-developed and well-nourished. No distress.  HENT:  Head: Normocephalic and atraumatic.  Right Ear: Tympanic membrane, external ear and ear canal normal.  Left Ear: Tympanic membrane, external ear and ear canal normal.  Nose: Mucosal edema and rhinorrhea present. No nose lacerations, sinus tenderness, nasal deformity, septal deviation or nasal septal hematoma. No epistaxis.  No foreign bodies. Right sinus exhibits maxillary sinus tenderness and frontal sinus tenderness. Left sinus exhibits maxillary sinus tenderness and frontal sinus tenderness.  Mouth/Throat: Uvula is midline and mucous membranes are normal. Oropharyngeal exudate and posterior oropharyngeal erythema present. Tonsillar exudate.  Eyes: Conjunctivae are normal. Right eye exhibits no discharge. Left eye exhibits no discharge. No scleral icterus.  Neck: Normal range of motion. Neck supple. No thyromegaly present.  Cardiovascular: Normal rate, regular rhythm and normal heart sounds.  Pulmonary/Chest: Effort normal and breath sounds normal. No stridor. No respiratory distress. She has no wheezes. She has no rales.  Lymphadenopathy:    She has no cervical adenopathy.  Skin: She is not diaphoretic.  Nursing note and vitals reviewed.    UC Treatments / Results  Labs (all labs ordered are listed,  but only abnormal results are displayed) Labs Reviewed  RAPID STREP SCREEN ([facility] & [facility] ONLY) - Abnormal; Notable for the following components:      Result Value   Streptococcus, Group A Screen (Direct) POSITIVE (*)    All other components within normal limits    EKG None  Radiology No results found.  Procedures Procedures (  including critical care time)  Medications Ordered in UC Medications - No data to display  Initial Impression / Assessment and Plan / UC Course  I have reviewed the triage vital signs and the nursing notes.  Pertinent labs & imaging results that were available during my care of the patient were reviewed by me and considered in my medical decision making (see chart for details).     Final Clinical Impressions(s) / UC Diagnoses   Final diagnoses:  Strep pharyngitis   Discharge Instructions   None    ED Prescriptions    Medication Sig Dispense Auth. Provider   azithromycin (ZITHROMAX Z-PAK) 250 MG tablet 2 tabs po once day 1, then 1 tab po qd x 4 days 6 each Corona Popovich, MD   lidocaine (XYLOCAINE) 2 % solution 20 ml gargle and spit q 6 hours 100 mL Payton Mccallum, MD     1. Lab result and diagnosis reviewed with patient 2. rx as per orders above; reviewed possible side effects, interactions, risks and benefits  3. Recommend supportive treatment with otc analgesics prn  4. Follow-up prn if symptoms worsen or don't improve   Controlled Substance Prescriptions [place] Controlled Substance Registry consulted? Not Applicable   Payton Mccallum, MD 10/10/17 1705

## 2018-07-03 ENCOUNTER — Other Ambulatory Visit: Payer: Self-pay

## 2018-07-03 ENCOUNTER — Ambulatory Visit
Admission: EM | Admit: 2018-07-03 | Discharge: 2018-07-03 | Disposition: A | Discharge status: Home / Self Care | Payer: Medicaid Other | Attending: Family Medicine | Admitting: Family Medicine

## 2018-07-03 ENCOUNTER — Encounter: Payer: Self-pay | Admitting: Emergency Medicine

## 2018-07-03 DIAGNOSIS — H60501 Unspecified acute noninfective otitis externa, right ear: Secondary | ICD-10-CM

## 2018-07-03 MED ORDER — CIPROFLOXACIN-DEXAMETHASONE 0.3-0.1 % OT SUSP: 4.0000 [drp] | Freq: Two times a day (BID) | OTIC | 0 refills | Status: DC

## 2018-07-03 NOTE — ED Triage Notes (Signed)
Patient c/o right ear pain that started yesterday.  Patient had used some old antibiotic ear drops a week ago with some relief.

## 2018-07-03 NOTE — ED Provider Notes (Signed)
[facility]    CSN: [id number] Arrival date & time: 07/03/18  [id number]  History   Chief Complaint Chief Complaint  Patient presents with  . Otalgia    right   HPI   33 year old female presents with ear pain.  Patient states that she has some ear pain last week.  She has been using an prescription for antibiotic eardrops.  She states that she had improvement.  However, her ear pain recurred today.  Right ear pain is severe.  She feels like the canal is swollen.  She reports muffled hearing.  No discharge.  She reports subjective fever.  She has had no documented fever.  Tender to the touch.  No relieving factors.  No other associated symptoms.  PMH, Surgical Hx, Family Hx, Social History reviewed and updated as below.  PMH: Patient Active Problem List   Diagnosis Date Noted  . Pneumonia 11/04/2015  . Gestational hypertension 10/29/2015  . History of cervical LEEP biopsy affecting care of mother, antepartum 10/29/2015  . Depression affecting pregnancy, antepartum 10/29/2015  . GBS (group B Streptococcus carrier), +RV culture, currently pregnant 10/29/2015  . History of precipitous delivery 10/28/2015  . Obesity affecting pregnancy in third trimester, antepartum 10/28/2015  . Female sterility 10/28/2015    Past Surgical History:  Procedure Laterality Date  . CHOLECYSTECTOMY    . LAPAROSCOPIC BILATERAL SALPINGECTOMY Bilateral 12/11/2015   Procedure: LAPAROSCOPIC BILATERAL SALPINGECTOMY;  Surgeon: Nadara Mustardobert P Harris, MD;  Location: [facility];  Service: Gynecology;  Laterality: Bilateral;  . LAPAROSCOPY N/A 12/11/2015   Procedure: LAPAROSCOPY OPERATIVE;  Surgeon: Nadara Mustardobert P Harris, MD;  Location: [facility];  Service: Gynecology;  Laterality: N/A;  . LEEP  2013  . LEEP    . NEXPLANON     REMOVAL    OB History    Gravida  5   Para  4   Term  4   Preterm      AB  1   Living  4     SAB  1   TAB      Ectopic      Multiple  0   Live Births  4             Home Medications    Prior to Admission medications   Medication Sig Start Date End Date Taking? Authorizing Provider  amphetamine-dextroamphetamine (ADDERALL) 20 MG tablet Take by mouth. 08/29/17  Yes [provider]  DULoxetine (CYMBALTA) 30 MG capsule Take by mouth daily. 07/26/17  Yes [provider]  ibuprofen (ADVIL,MOTRIN) 600 MG tablet Take 600 mg by mouth every 6 (six) hours as needed for mild pain.   Yes [provider]  ciprofloxacin-dexamethasone (CIPRODEX) OTIC suspension Place 4 drops into the right ear 2 (two) times daily. 07/03/18   Tommie Samsook, Marabeth Melland G, DO  labetalol (NORMODYNE) 100 MG tablet Take 100 mg by mouth 2 (two) times daily.    [provider]    Family History Family History  Problem Relation Age of Onset  . CAD Father   . COPD Father     Social History Social History   Tobacco Use  . Smoking status: Current Some Day Smoker    Packs/day: 0.50    Years: 4.50    Pack years: 2.25    Types: Cigarettes  . Smokeless tobacco: Never Used  Substance Use Topics  . Alcohol use: No    Alcohol/week: 0.0 standard drinks  . Drug use: No     Allergies  Penicillins and Other   Review of Systems Review of Systems  Constitutional: Negative.   HENT: Positive for ear pain.    Physical Exam Triage Vital Signs ED Triage Vitals  Enc Vitals Group     BP 07/03/18 0829 128/86     Pulse Rate 07/03/18 0829 90     Resp 07/03/18 0829 16     Temp 07/03/18 0829 98.3 F (36.8 C)     Temp Source 07/03/18 0829 Oral     SpO2 07/03/18 0829 100 %     Weight 07/03/18 0827 180 lb (81.6 kg)     Height 07/03/18 0827 5\' 3"  (1.6 m)     Head Circumference --      Peak Flow --      Pain Score 07/03/18 0826 4     Pain Loc --      Pain Edu? --      Excl. in GC? --    Updated Vital Signs BP 128/86 (BP Location: Left Arm)   Pulse 90   Temp 98.3 F (36.8 C) (Oral)   Resp 16   Ht 5\' 3"  (1.6 m)   Wt 81.6 kg   LMP 06/04/2018 (Approximate)    SpO2 100%   BMI 31.89 kg/m   Visual Acuity Right Eye Distance:   Left Eye Distance:   Bilateral Distance:    Right Eye Near:   Left Eye Near:    Bilateral Near:     Physical Exam Vitals signs and nursing note reviewed.  Constitutional:      General: She is not in acute distress.    Appearance: Normal appearance.  HENT:     Head: Normocephalic and atraumatic.     Left Ear: Tympanic membrane normal.     Ears:     Comments: Right ear -tragal tenderness.  Swollen canal. Eyes:     General:        Left eye: No discharge.     Conjunctiva/sclera: Conjunctivae normal.  Cardiovascular:     Rate and Rhythm: Normal rate and regular rhythm.  Pulmonary:     Effort: Pulmonary effort is normal.     Breath sounds: No wheezing, rhonchi or rales.  Neurological:     Mental Status: She is alert.  Psychiatric:        Mood and Affect: Mood normal.        Behavior: Behavior normal.     UC Treatments / Results  Labs (all labs ordered are listed, but only abnormal results are displayed) Labs Reviewed - No data to display  EKG None  Radiology No results found.  Procedures Procedures (including critical care time)  Medications Ordered in UC Medications - No data to display  Initial Impression / Assessment and Plan / UC Course  I have reviewed the triage vital signs and the nursing notes.  Pertinent labs & imaging results that were available during my care of the patient were reviewed by me and considered in my medical decision making (see chart for details).    33 year old female presents with otitis externa.  Treating with Ciprodex.  Final Clinical Impressions(s) / UC Diagnoses   Final diagnoses:  Acute otitis externa of right ear, unspecified type   Discharge Instructions   None    ED Prescriptions    Medication Sig Dispense Auth. Provider   ciprofloxacin-dexamethasone (CIPRODEX) OTIC suspension Place 4 drops into the right ear 2 (two) times daily. 7.5 mL Everlene Otherook,  Raizel Wesolowski G, DO     Controlled Substance  Prescriptions [place] Controlled Substance Registry consulted? Not Applicable   Tommie Sams, [place] 07/03/18 1610

## 2018-12-23 ENCOUNTER — Other Ambulatory Visit: Payer: Self-pay

## 2018-12-23 ENCOUNTER — Ambulatory Visit: Payer: Medicaid Other

## 2018-12-23 ENCOUNTER — Ambulatory Visit
Admission: EM | Admit: 2018-12-23 | Discharge: 2018-12-23 | Disposition: A | Discharge status: Home / Self Care | Payer: Medicaid Other | Attending: Family Medicine | Admitting: Family Medicine

## 2018-12-23 DIAGNOSIS — Y92010 Kitchen of single-family (private) house as the place of occurrence of the external cause: Secondary | ICD-10-CM

## 2018-12-23 DIAGNOSIS — W010XXA Fall on same level from slipping, tripping and stumbling without subsequent striking against object, initial encounter: Secondary | ICD-10-CM | POA: Diagnosis not present

## 2018-12-23 DIAGNOSIS — S93402A Sprain of unspecified ligament of left ankle, initial encounter: Secondary | ICD-10-CM | POA: Diagnosis not present

## 2018-12-23 MED ORDER — MELOXICAM 15 MG PO TABS: 15.0000 mg | ORAL_TABLET | Freq: Every day | ORAL | 0 refills | Status: DC | PRN

## 2018-12-23 NOTE — Discharge Instructions (Signed)
Rest, Ice, Elevation.  Medication as prescribed.  Crutches for comfort.  Take care  Dr. Lacinda Axon

## 2018-12-23 NOTE — ED Triage Notes (Signed)
Patient complains of left foot/ankle pain that started after a fall on Friday night. Patient states that she rolled her ankle and it went underneath her. Patient has swelling to left ankle and pain that radiates through her toes on the top of her foot.

## 2018-12-25 NOTE — ED Provider Notes (Signed)
[facility]    CSN: [id number] Arrival date & time: 12/23/18  1506   History   Chief Complaint Chief Complaint  Patient presents with  . Ankle Pain    left  . Foot Pain    left    HPI  33 year old female presents with left ankle and foot pain.  Patient states she fell in her kitchen on Friday night.  She states that she twisted her foot and ankle.  She reports that she has significant swelling and pain.  Pain is currently 7/10 in severity.  Worse with activity.  Patient finds it difficult to ambulate.  No relieving factors.  No reports of bruising.  No other associated symptoms.  No other complaints.  PMH, Surgical Hx, Family Hx, Social History reviewed and updated as below.  Past Medical History:  Diagnosis Date  . Hypertension    PIH   Patient Active Problem List   Diagnosis Date Noted  . Pneumonia 11/04/2015  . Gestational hypertension 10/29/2015  . History of cervical LEEP biopsy affecting care of mother, antepartum 10/29/2015  . Depression affecting pregnancy, antepartum 10/29/2015  . GBS (group B Streptococcus carrier), +RV culture, currently pregnant 10/29/2015  . History of precipitous delivery 10/28/2015  . Obesity affecting pregnancy in third trimester, antepartum 10/28/2015  . Female sterility 10/28/2015   Past Surgical History:  Procedure Laterality Date  . CHOLECYSTECTOMY    . LAPAROSCOPIC BILATERAL SALPINGECTOMY Bilateral 12/11/2015   Procedure: LAPAROSCOPIC BILATERAL SALPINGECTOMY;  Surgeon: Nadara Mustardobert P Harris, MD;  Location: [facility];  Service: Gynecology;  Laterality: Bilateral;  . LAPAROSCOPY N/A 12/11/2015   Procedure: LAPAROSCOPY OPERATIVE;  Surgeon: Nadara Mustardobert P Harris, MD;  Location: [facility];  Service: Gynecology;  Laterality: N/A;  . LEEP  2013  . LEEP    . NEXPLANON     REMOVAL    OB History    Gravida  5   Para  4   Term  4   Preterm      AB  1   Living  4     SAB  1   TAB      Ectopic      Multiple  0   Live  Births  4            Home Medications    Prior to Admission medications   Medication Sig Start Date End Date Taking? Authorizing Provider  amphetamine-dextroamphetamine (ADDERALL) 20 MG tablet Take by mouth. 08/29/17  Yes [provider]  DULoxetine (CYMBALTA) 30 MG capsule Take by mouth daily. 07/26/17  Yes [provider]  meloxicam (MOBIC) 15 MG tablet Take 1 tablet (15 mg total) by mouth daily as needed for pain. 12/23/18   Tommie Samsook, Bird Swetz G, DO  labetalol (NORMODYNE) 100 MG tablet Take 100 mg by mouth 2 (two) times daily.  12/23/18  [provider]    Family History Family History  Problem Relation Age of Onset  . CAD Father   . COPD Father     Social History Social History   Tobacco Use  . Smoking status: Current Some Day Smoker    Packs/day: 0.50    Years: 4.50    Pack years: 2.25    Types: Cigarettes  . Smokeless tobacco: Never Used  Substance Use Topics  . Alcohol use: No    Alcohol/week: 0.0 standard drinks  . Drug use: No     Allergies   Penicillins and Other   Review of Systems Review of Systems  Constitutional:  Negative.   Musculoskeletal:       Left foot and ankle pain/swelling.   Physical Exam Triage Vital Signs ED Triage Vitals  Enc Vitals Group     BP 12/23/18 1518 114/75     Pulse Rate 12/23/18 1518 (!) 116     Resp 12/23/18 1518 16     Temp 12/23/18 1518 98.7 F (37.1 C)     Temp Source 12/23/18 1518 Oral     SpO2 12/23/18 1518 100 %     Weight 12/23/18 1516 180 lb (81.6 kg)     Height 12/23/18 1516 5\' 3"  (1.6 m)     Head Circumference --      Peak Flow --      Pain Score 12/23/18 1516 7     Pain Loc --      Pain Edu? --      Excl. in [place]? --    Updated Vital Signs BP 114/75 (BP Location: Left Arm)   Pulse (!) 116   Temp 98.7 F (37.1 C) (Oral)   Resp 16   Ht 5\' 3"  (1.6 m)   Wt 81.6 kg   LMP 12/16/2018   SpO2 100%   BMI 31.89 kg/m   Visual Acuity Right Eye Distance:   Left Eye Distance:    Bilateral Distance:    Right Eye Near:   Left Eye Near:    Bilateral Near:     Physical Exam Vitals signs and nursing note reviewed.  Constitutional:      General: She is not in acute distress.    Appearance: Normal appearance.  HENT:     Head: Normocephalic and atraumatic.  Eyes:     General:        Right eye: No discharge.        Left eye: No discharge.     Conjunctiva/sclera: Conjunctivae normal.  Pulmonary:     Effort: Pulmonary effort is normal. No respiratory distress.  Musculoskeletal:     Comments: Left foot -tenderness over the lateral aspect near the fifth metatarsal base.  Left ankle -diffusely tender throughout.  Mild swelling.  Skin:    General: Skin is warm.     Findings: No bruising.  Neurological:     Mental Status: She is alert.  Psychiatric:        Mood and Affect: Mood normal.        Behavior: Behavior normal.    UC Treatments / Results  Labs (all labs ordered are listed, but only abnormal results are displayed) Labs Reviewed - No data to display  EKG   Radiology Dg Ankle Complete Left  Result Date: 12/23/2018 CLINICAL DATA:  Pain and swelling left foot and ankle after twisting injury. EXAM: LEFT ANKLE COMPLETE - 3+ VIEW COMPARISON:  None. FINDINGS: There is no evidence of fracture, dislocation, or joint effusion. There is no evidence of arthropathy or other focal bone abnormality. Soft tissues are unremarkable. IMPRESSION: Negative. Electronically Signed   By: Marin Olp M.D.   On: 12/23/2018 15:37   Dg Foot Complete Left  Result Date: 12/23/2018 CLINICAL DATA:  Left foot and ankle pain after twisting injury. EXAM: LEFT FOOT - COMPLETE 3+ VIEW COMPARISON:  None. FINDINGS: There is no evidence of fracture or dislocation. There is no evidence of arthropathy or other focal bone abnormality. Soft tissues are unremarkable. IMPRESSION: Negative. Electronically Signed   By: Marin Olp M.D.   On: 12/23/2018 15:38    Procedures Procedures  (including critical care time)  Medications Ordered in UC Medications - No data to display  Initial Impression / Assessment and Plan / UC Course  I have reviewed the triage vital signs and the nursing notes.  Pertinent labs & imaging results that were available during my care of the patient were reviewed by me and considered in my medical decision making (see chart for details).    33 year old female presents with left ankle sprain.  X-ray negative.  Meloxicam as needed.  Crutches given for comfort.  Supportive care.  Work note given.  Final Clinical Impressions(s) / UC Diagnoses   Final diagnoses:  Sprain of left ankle, unspecified ligament, initial encounter     Discharge Instructions     Rest, Ice, Elevation.  Medication as prescribed.  Crutches for comfort.  Take care  Dr. Adriana Simasook    ED Prescriptions    Medication Sig Dispense Auth. Provider   meloxicam (MOBIC) 15 MG tablet Take 1 tablet (15 mg total) by mouth daily as needed for pain. 30 tablet Tommie Samsook, Dominic Rhome G, DO     Controlled Substance Prescriptions [place] Controlled Substance Registry consulted? Not Applicable   Tommie SamsCook, Ichiro Chesnut G, [place] 12/25/18 [phone]

## 2020-05-11 ENCOUNTER — Other Ambulatory Visit: Payer: Self-pay

## 2020-05-11 ENCOUNTER — Ambulatory Visit
Admission: EM | Admit: 2020-05-11 | Discharge: 2020-05-11 | Disposition: A | Discharge status: Home / Self Care | Payer: Medicaid Other

## 2020-05-11 ENCOUNTER — Encounter: Payer: Self-pay | Admitting: Emergency Medicine

## 2020-05-11 DIAGNOSIS — K130 Diseases of lips: Secondary | ICD-10-CM

## 2020-05-11 MED ORDER — LIDOCAINE VISCOUS HCL 2 % MT SOLN: 5.0000 mL | OROMUCOSAL | 0 refills | Status: AC | PRN

## 2020-05-11 MED ORDER — CLINDAMYCIN HCL 300 MG PO CAPS: 300.0000 mg | ORAL_CAPSULE | Freq: Four times a day (QID) | ORAL | 0 refills | Status: AC

## 2020-05-11 NOTE — Discharge Instructions (Signed)
Begin antibiotics for lip infection.  Continue to apply warm compresses to the area.  Continue your ibuprofen Tylenol.  Also sent Viscous Lidocaine to apply to the area topically for back pain relief.  Symptoms should be getting a lot better next 2 to 3 days.  If they are not, please call us we can change the antibiotic if needed.  Go to the emergency department if any symptoms severely worsen.

## 2020-05-11 NOTE — ED Provider Notes (Signed)
[facility]    CSN: [id number] Arrival date & time: 05/11/20  1105      History   Chief Complaint Chief Complaint  Patient presents with  . Oral Swelling    HPI Lisa Adkins is a 34 y.o. female presenting for 2-day history of lower lip swelling and pain.  Patient states that initially she noticed a pustule and then she popped it and noticed pus came out.  She states that the area crusted over and then she developed subsequent swelling of the lip and increased redness.  Patient states that the whole lip hurts to touch.  She says she has tried warm compresses but it has not helped.  She is taken ibuprofen and Tylenol for pain but that has not helped the pain.  She denies any associated fever, fatigue, chills.  She states she has had something like this happen in the past and was given prednisone which seemed to help.  She denies any history of cold sores.  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  No known history of MRSA.  She denies any other complaints or concerns at this time.  HPI  Past Medical History:  Diagnosis Date  . Hypertension    PIH  . Medical history non-contributory     Patient Active Problem List   Diagnosis Date Noted  . Pneumonia 11/04/2015  . Gestational hypertension 10/29/2015  . History of cervical LEEP biopsy affecting care of mother, antepartum 10/29/2015  . Depression affecting pregnancy, antepartum 10/29/2015  . GBS (group B Streptococcus carrier), +RV culture, currently pregnant 10/29/2015  . History of precipitous delivery 10/28/2015  . Obesity affecting pregnancy in third trimester, antepartum 10/28/2015  . Female sterility 10/28/2015    Past Surgical History:  Procedure Laterality Date  . CHOLECYSTECTOMY    . LAPAROSCOPIC BILATERAL SALPINGECTOMY Bilateral 12/11/2015   Procedure: LAPAROSCOPIC  BILATERAL SALPINGECTOMY;  Surgeon: Nadara Mustard, MD;  Location: [facility];  Service: Gynecology;  Laterality: Bilateral;  . LAPAROSCOPY N/A 12/11/2015   Procedure: LAPAROSCOPY OPERATIVE;  Surgeon: Nadara Mustard, MD;  Location: [facility];  Service: Gynecology;  Laterality: N/A;  . LEEP  2013  . LEEP    . NEXPLANON     REMOVAL    OB History    Gravida  5   Para  4   Term  4   Preterm      AB  1   Living  4     SAB  1   TAB      Ectopic      Multiple  0   Live Births  4            Home Medications    Prior to Admission medications   Medication Sig Start Date End Date Taking? Authorizing Provider  amphetamine-dextroamphetamine (ADDERALL) 20 MG tablet Take by mouth. 08/29/17  Yes [provider]  ibuprofen (ADVIL) 800 MG tablet Take 1 tablet by mouth every 8 (eight) hours as needed for pain. Up to 20 doses 02/06/20  Yes [provider]  clindamycin (CLEOCIN) 300 MG capsule Take 1 capsule (300 mg total) by mouth 4 (four) times daily for 7 days. 05/11/20 05/18/20  Eusebio Friendly B, PA-C  DULoxetine (CYMBALTA) 30 MG capsule Take by mouth daily. 07/26/17   [provider]  lidocaine (XYLOCAINE) 2 % solution  Use as directed 5 mLs in the mouth or throat as needed for up to 7 days for mouth pain (q2h). Apply to lip for pain relief 05/11/20 05/18/20  Shirlee Latch, PA-C  meloxicam (MOBIC) 15 MG tablet Take 1 tablet (15 mg total) by mouth daily as needed for pain. 12/23/18   Tommie Sams, DO  labetalol (NORMODYNE) 100 MG tablet Take 100 mg by mouth 2 (two) times daily.  12/23/18  [provider]    Family History Family History  Problem Relation Age of Onset  . CAD Father   . COPD Father   . Lung cancer Father   . Cancer Mother   . Lung cancer Mother     Social History Social History   Tobacco Use  . Smoking status: Current Every Day Smoker    Packs/day: 0.50    Years: 10.00    Pack years: 5.00    Types: Cigarettes  . Smokeless  tobacco: Never Used  Vaping Use  . Vaping Use: Never used  Substance Use Topics  . Alcohol use: Yes    Alcohol/week: 0.0 standard drinks    Comment: rare  . Drug use: No     Allergies   Penicillins and Other   Review of Systems Review of Systems  Constitutional: Negative for fatigue and fever.  HENT:       Right lower lip pain/swelling/redness  Skin: Positive for color change. Negative for rash and wound.  Neurological: Negative for dizziness, weakness and headaches.  Hematological: Negative for adenopathy.     Physical Exam Triage Vital Signs ED Triage Vitals  Enc Vitals Group     BP 05/11/20 1157 (!) 127/99     Pulse Rate 05/11/20 1157 88     Resp 05/11/20 1157 18     Temp 05/11/20 1157 98.8 F (37.1 C)     Temp Source 05/11/20 1157 Oral     SpO2 05/11/20 1157 100 %     Weight 05/11/20 1158 149 lb (67.6 kg)     Height 05/11/20 1158 5\' 3"  (1.6 m)     Head Circumference --      Peak Flow --      Pain Score 05/11/20 1156 10     Pain Loc --      Pain Edu? --      Excl. in GC? --    No data found.  Updated Vital Signs BP (!) 127/99 (BP Location: Left Arm)   Pulse 88   Temp 98.8 F (37.1 C) (Oral)   Resp 18   Ht 5\' 3"  (1.6 m)   Wt 149 lb (67.6 kg)   LMP 05/09/2020 (Exact Date)   SpO2 100%   BMI 26.39 kg/m      Physical Exam Vitals and nursing note reviewed.  Constitutional:      General: She is not in acute distress.    Appearance: Normal appearance. She is not ill-appearing or toxic-appearing.  HENT:     Head: Normocephalic and atraumatic.     Nose: Nose normal.     Mouth/Throat:     Lips: Lesions (small crusted lesion angle of right side, small pustule of lower lip, moderate swelling right half of lower lip. Area diffusely tender) present.     Mouth: Mucous membranes are moist.     Pharynx: Oropharynx is clear.  Eyes:     General: No scleral icterus.       Right eye: No discharge.  Left eye: No discharge.     Conjunctiva/sclera:  Conjunctivae normal.  Cardiovascular:     Rate and Rhythm: Normal rate and regular rhythm.  Pulmonary:     Effort: Pulmonary effort is normal. No respiratory distress.  Musculoskeletal:     Cervical back: Neck supple.  Skin:    General: Skin is dry.  Neurological:     General: No focal deficit present.     Mental Status: She is alert. Mental status is at baseline.     Motor: No weakness.     Gait: Gait normal.  Psychiatric:        Mood and Affect: Mood normal.        Behavior: Behavior normal.        Thought Content: Thought content normal.      UC Treatments / Results  Labs (all labs ordered are listed, but only abnormal results are displayed) Labs Reviewed - No data to display  EKG   Radiology No results found.  Procedures Procedures (including critical care time)  Medications Ordered in UC Medications - No data to display  Initial Impression / Assessment and Plan / UC Course  I have reviewed the triage vital signs and the nursing notes.  Pertinent labs & imaging results that were available during my care of the patient were reviewed by me and considered in my medical decision making (see chart for details).    Treating suspected cellulitis of lip with clindamycin.  Patient has severe allergy to penicillins.  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  Patient states that she is not comfortable taking a lot of antibiotics and she is only really comfortable taking clindamycin.  Advised her that there are other antibiotics that might be more helpful in the situation including Bactrim DS or doxycycline.  Patient declines at this time.  Advised her to follow-up with our department if symptoms or not looking better in the next 2 to 3 days or if anything gets worse.  ED precautions discussed especially if she develops a fever or severely acute worsening symptoms.  Sent Viscous Lidocaine for pain.  She also presuming stronger, but I do not believe that is warranted at this  time.  Final Clinical Impressions(s) / UC Diagnoses   Final diagnoses:  Cellulitis of lip     Discharge Instructions     Begin antibiotics for lip infection.  Continue to apply warm compresses to the area.  Continue your ibuprofen Tylenol.  Also sent Viscous Lidocaine to apply to the area topically for back pain relief.  Symptoms should be getting a lot better next 2 to 3 days.  If they are not, please call us we can change the antibiotic if needed.  Go to the emergency department if any symptoms severely worsen.    ED Prescriptions    Medication Sig Dispense Auth. Provider   clindamycin (CLEOCIN) 300 MG capsule Take 1 capsule (300 mg total) by mouth 4 (four) times daily for 7 days. 28 capsule Eusebio Friendly B, PA-C   lidocaine (XYLOCAINE) 2 % solution Use as directed 5 mLs in the mouth or throat as needed for up to 7 days for mouth pain (q2h). Apply to lip for pain relief 50 mL Shirlee Latch, PA-C     PDMP not reviewed this encounter.   Shirlee Latch, PA-C 05/11/20 1352

## 2020-05-11 NOTE — ED Triage Notes (Signed)
Patient in today c/o swollen and painful lower lip x 2 days. Patient states that it started as a bump. Patient has tried putting warm compresses to her lip without relief.

## 2020-09-29 ENCOUNTER — Other Ambulatory Visit: Payer: Self-pay

## 2020-09-29 ENCOUNTER — Ambulatory Visit
Admission: EM | Admit: 2020-09-29 | Discharge: 2020-09-29 | Disposition: A | Discharge status: Home / Self Care | Payer: Medicaid Other | Attending: Physician Assistant | Admitting: Physician Assistant

## 2020-09-29 DIAGNOSIS — M545 Low back pain, unspecified: Secondary | ICD-10-CM

## 2020-09-29 MED ORDER — TIZANIDINE HCL 4 MG PO TABS: 4.0000 mg | ORAL_TABLET | Freq: Three times a day (TID) | ORAL | 0 refills | Status: AC | PRN

## 2020-09-29 MED ORDER — HYDROCODONE-ACETAMINOPHEN 5-325 MG PO TABS: 1.0000 | ORAL_TABLET | Freq: Three times a day (TID) | ORAL | 0 refills | Status: AC | PRN

## 2020-09-29 MED ORDER — METHYLPREDNISOLONE 4 MG PO TBPK: ORAL_TABLET | ORAL | 0 refills | Status: DC

## 2020-09-29 NOTE — Discharge Instructions (Addendum)
BACK PAIN: Stressed avoiding painful activities . RICE (REST, ICE, COMPRESSION, ELEVATION) guidelines reviewed. May alternate ice and heat. Consider use of muscle rubs, Salonpas patches, etc. Use medications as directed including muscle relaxers if prescribed. Take anti-inflammatory medications as prescribed or OTC NSAIDs/Tylenol.  F/u with PCP in 7-10 days for reexamination, and please feel free to call or return to the urgent care at any time for any questions or concerns you may have and we will be happy to help you!  ° °BACK PAIN RED FLAGS: If the back pain acutely worsens or there are any red flag symptoms such as numbness/tingling, leg weakness, saddle anesthesia, or loss of bowel/bladder control, go immediately to the ER. Follow up with us as scheduled or sooner if the pain does not begin to resolve or if it worsens before the follow up   ° °You may have a condition requiring you to follow up with Orthopedics so please call one of the following office for appointment:  ° °[facility] °[address], [place], [place] [address] °Phone: [phone] ° °[facility] °[address], [place], [place] [address] °Phone: [phone]  °

## 2020-09-29 NOTE — ED Triage Notes (Signed)
Pt c/o lower back pain above her buttocks since Saturday. Pt states she has not fallen and has had no injury.

## 2020-09-29 NOTE — ED Provider Notes (Signed)
[facility]    CSN: [id number] Arrival date & time: 09/29/20  1401      History   Chief Complaint Chief Complaint  Patient presents with  . Back Pain    HPI Lisa Adkins is a 35 y.o. female presenting for approximately 3-day history of midline lower back pain that is severe.  Patient says that pain is getting worse.  She describes it as constant and aching.  She says that she gets sharp and stabbing pains whenever she leans forward or raises her right leg.  Denies any radiation of pain down the lower extremities.  No numbness, weakness or tingling.  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  Patient was taken over-the-counter ibuprofen and used a heating pad.  She says that she is also taking Tylenol and Flexeril without but those medications have not improved her pain.  She denies any urinary symptoms.  She has not had any fevers.  She has no other complaints or concerns.  HPI  Past Medical History:  Diagnosis Date  . Hypertension    PIH  . Medical history non-contributory     Patient Active Problem List   Diagnosis Date Noted  . Pneumonia 11/04/2015  . Gestational hypertension 10/29/2015  . History of cervical LEEP biopsy affecting care of mother, antepartum 10/29/2015  . Depression affecting pregnancy, antepartum 10/29/2015  . GBS (group B Streptococcus carrier), +RV culture, currently pregnant 10/29/2015  . History of precipitous delivery 10/28/2015  . Obesity affecting pregnancy in third trimester, antepartum 10/28/2015  . Female sterility 10/28/2015    Past Surgical History:  Procedure Laterality Date  . CHOLECYSTECTOMY    . LAPAROSCOPIC BILATERAL SALPINGECTOMY Bilateral 12/11/2015   Procedure: LAPAROSCOPIC BILATERAL SALPINGECTOMY;  Surgeon: Nadara Mustard, MD;  Location: [facility];  Service: Gynecology;  Laterality: Bilateral;  . LAPAROSCOPY N/A 12/11/2015   Procedure:  LAPAROSCOPY OPERATIVE;  Surgeon: Nadara Mustard, MD;  Location: [facility];  Service: Gynecology;  Laterality: N/A;  . LEEP  2013  . LEEP    . NEXPLANON     REMOVAL    OB History    Gravida  5   Para  4   Term  4   Preterm      AB  1   Living  4     SAB  1   IAB      Ectopic      Multiple  0   Live Births  4            Home Medications    Prior to Admission medications   Medication Sig Start Date End Date Taking? Authorizing Provider  amphetamine-dextroamphetamine (ADDERALL) 20 MG tablet Take by mouth. 08/29/17  Yes [provider]  HYDROcodone-acetaminophen (NORCO/VICODIN) 5-325 MG tablet Take 1 tablet by mouth every 8 (eight) hours as needed for up to 5 days. 09/29/20 10/04/20 Yes Shirlee Latch, PA-C  methylPREDNISolone (MEDROL DOSEPAK) 4 MG TBPK tablet Take 6 tablets p.o. today and decrease by 1 tablet daily until completed 09/29/20  Yes Eusebio Friendly B, PA-C  tiZANidine (ZANAFLEX) 4 MG tablet Take 1 tablet (4 mg total) by mouth every 8 (eight) hours as needed for up to 7 days for muscle spasms. 09/29/20 10/06/20 Yes Shirlee Latch, PA-C  DULoxetine (CYMBALTA) 30 MG capsule Take by mouth daily. 07/26/17   [provider]  labetalol (NORMODYNE)  100 MG tablet Take 100 mg by mouth 2 (two) times daily.  12/23/18  [provider]    Family History Family History  Problem Relation Age of Onset  . CAD Father   . COPD Father   . Lung cancer Father   . Cancer Mother   . Lung cancer Mother     Social History Social History   Tobacco Use  . Smoking status: Current Every Day Smoker    Packs/day: 0.50    Years: 10.00    Pack years: 5.00    Types: Cigarettes  . Smokeless tobacco: Never Used  Vaping Use  . Vaping Use: Never used  Substance Use Topics  . Alcohol use: Yes    Alcohol/week: 0.0 standard drinks    Comment: rare  . Drug use: No     Allergies   Penicillins and Other   Review of Systems Review of Systems   Constitutional: Negative for fatigue and fever.  Gastrointestinal: Negative for abdominal pain, nausea and vomiting.  Genitourinary: Negative for dysuria, flank pain, frequency and urgency.  Musculoskeletal: Positive for back pain.  Skin: Negative for rash.  Neurological: Negative for weakness and numbness.     Physical Exam Triage Vital Signs ED Triage Vitals  Enc Vitals Group     BP 09/29/20 1424 124/75     Pulse Rate 09/29/20 1424 92     Resp 09/29/20 1424 18     Temp 09/29/20 1424 98.5 F (36.9 C)     Temp Source 09/29/20 1424 Oral     SpO2 09/29/20 1424 100 %     Weight 09/29/20 1423 181 lb (82.1 kg)     Height 09/29/20 1423 5\' 3"  (1.6 m)     Head Circumference --      Peak Flow --      Pain Score 09/29/20 1423 8     Pain Loc --      Pain Edu? --      Excl. in GC? --    No data found.  Updated Vital Signs BP 124/75 (BP Location: Right Arm)   Pulse 92   Temp 98.5 F (36.9 C) (Oral)   Resp 18   Ht 5\' 3"  (1.6 m)   Wt 181 lb (82.1 kg)   LMP 09/07/2020   SpO2 100%   BMI 32.06 kg/m   Physical Exam Vitals and nursing note reviewed.  Constitutional:      General: She is not in acute distress.    Appearance: Normal appearance. She is not ill-appearing or toxic-appearing.  HENT:     Head: Normocephalic and atraumatic.  Eyes:     General: No scleral icterus.       Right eye: No discharge.        Left eye: No discharge.     Conjunctiva/sclera: Conjunctivae normal.  Cardiovascular:     Rate and Rhythm: Normal rate and regular rhythm.     Heart sounds: Normal heart sounds.  Pulmonary:     Effort: Pulmonary effort is normal. No respiratory distress.     Breath sounds: Normal breath sounds.  Abdominal:     Palpations: Abdomen is soft.     Tenderness: There is no abdominal tenderness. There is no right CVA tenderness or left CVA tenderness.  Musculoskeletal:     Cervical back: Neck supple.     Lumbar back: Tenderness (TTP L4-L5, L5-S1) present. Decreased range  of motion. Positive right straight leg raise test (increased back pain with SLR on right).  Negative left straight leg raise test.  Skin:    General: Skin is dry.  Neurological:     General: No focal deficit present.     Mental Status: She is alert. Mental status is at baseline.     Motor: No weakness.     Gait: Gait normal.  Psychiatric:        Mood and Affect: Mood normal.        Behavior: Behavior normal.        Thought Content: Thought content normal.      UC Treatments / Results  Labs (all labs ordered are listed, but only abnormal results are displayed) Labs Reviewed - No data to display  EKG   Radiology No results found.  Procedures Procedures (including critical care time)  Medications Ordered in UC Medications - No data to display  Initial Impression / Assessment and Plan / UC Course  I have reviewed the triage vital signs and the nursing notes.  Pertinent labs & imaging results that were available during my care of the patient were reviewed by me and considered in my medical decision making (see chart for details).   35 year old female presenting for midline low back pain not due to injury.  Pain worse with lifting the right leg and flexing forward.  Suspect possible bulging or herniated disc at this time.  Treating her with Medrol.  Also sent tizanidine and short course of Norco.  Advised stretches and continuing to apply heat and consider applying ice.  Advised to follow-up with PCP or Ortho if not improving over the next 4 to 6 weeks or for any worsening of symptoms.  Reviewed ED red flag signs and symptoms regarding back pain and patient.  Work note provided.   Final Clinical Impressions(s) / UC Diagnoses   Final diagnoses:  Acute midline low back pain without sciatica     Discharge Instructions     BACK PAIN: Stressed avoiding painful activities . RICE (REST, ICE, COMPRESSION, ELEVATION) guidelines reviewed. May alternate ice and heat. Consider use of  muscle rubs, Salonpas patches, etc. Use medications as directed including muscle relaxers if prescribed. Take anti-inflammatory medications as prescribed or OTC NSAIDs/Tylenol.  F/u with PCP in 7-10 days for reexamination, and please feel free to call or return to the urgent care at any time for any questions or concerns you may have and we will be happy to help you!   BACK PAIN RED FLAGS: If the back pain acutely worsens or there are any red flag symptoms such as numbness/tingling, leg weakness, saddle anesthesia, or loss of bowel/bladder control, go immediately to the ER. Follow up with [place] as scheduled or sooner if the pain does not begin to resolve or if it worsens before the follow up    You may have a condition requiring you to follow up with Orthopedics so please call one of the following office for appointment:   [facility] [address] [place], [place] [address] Phone: [phone]  [facility] [address] [place], [place] [address] Phone: [phone]     ED Prescriptions    Medication Sig Dispense Auth. Provider   methylPREDNISolone (MEDROL DOSEPAK) 4 MG TBPK tablet Take 6 tablets p.o. today and decrease by 1 tablet daily until completed 21 tablet Eusebio Friendly B, PA-C   tiZANidine (ZANAFLEX) 4 MG tablet Take 1 tablet (4 mg total) by mouth every 8 (eight) hours as needed for up to 7 days for muscle spasms. 20 tablet Michiel Cowboy,  Algis GreenhouseLesley B, PA-C   HYDROcodone-acetaminophen (NORCO/VICODIN) 5-325 MG tablet Take 1 tablet by mouth every 8 (eight) hours as needed for up to 5 days. 12 tablet Shirlee LatchEaves, Kenyetta Wimbish B, PA-C     I have reviewed the PDMP during this encounter.   Shirlee Latchaves, Cougar Imel B, PA-C 09/29/20 1455

## 2021-04-23 ENCOUNTER — Telehealth: Payer: Self-pay

## 2021-04-23 ENCOUNTER — Other Ambulatory Visit: Payer: Self-pay

## 2021-04-23 ENCOUNTER — Ambulatory Visit
Admission: EM | Admit: 2021-04-23 | Discharge: 2021-04-23 | Disposition: A | Discharge status: Home / Self Care | Payer: Medicaid Other

## 2021-04-23 DIAGNOSIS — M5442 Lumbago with sciatica, left side: Secondary | ICD-10-CM | POA: Diagnosis not present

## 2021-04-23 DIAGNOSIS — M62830 Muscle spasm of back: Secondary | ICD-10-CM | POA: Diagnosis not present

## 2021-04-23 DIAGNOSIS — M5441 Lumbago with sciatica, right side: Secondary | ICD-10-CM

## 2021-04-23 MED ORDER — PREDNISONE 10 MG (21) PO TBPK: ORAL_TABLET | Freq: Every day | ORAL | 0 refills | Status: DC

## 2021-04-23 MED ORDER — HYDROCODONE-ACETAMINOPHEN 5-325 MG PO TABS: 1.0000 | ORAL_TABLET | Freq: Four times a day (QID) | ORAL | 0 refills | Status: DC | PRN

## 2021-04-23 MED ORDER — TIZANIDINE HCL 4 MG PO TABS: 4.0000 mg | ORAL_TABLET | Freq: Three times a day (TID) | ORAL | 0 refills | Status: DC | PRN

## 2021-04-23 NOTE — Discharge Instructions (Addendum)
Recommend start Prednisone 10mg  - take 6 tablets today and tomorrow and then decrease by 1 tablet every 2 days until finished on day 12. Do not take any Ibuprofen while taking Prednisone. May take Zanaflex 4mg  every 8 hours as needed for muscle spasms. Continue to apply moist heat to area for comfort. May use Vicodin 1 to 2 tablets every 6 hours as needed for severe pain. Recommend contact [facility] for further evaluation of recurrent back pain.

## 2021-04-23 NOTE — ED Triage Notes (Signed)
Pt here with C/O back pain, not sure if pinch nerve, hard to move bend over. 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

## 2021-04-23 NOTE — ED Provider Notes (Signed)
[facility]    CSN: [id number] Arrival date & time: 04/23/21  [id number]      History   Chief Complaint Chief Complaint  Patient presents with   Back Pain    HPI Lisa Adkins is a 35 y.o. female.   35 year old female presents with bilateral lower back pain that has become worse over the pat 24 to 48 hours. Has history of intermittent back pain- usually on left side- with muscle spasms. 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 Today lifted a patient and felt more immediate pain. Denies any fever, hematuria or numbness. Pain does radiate to both upper legs. Has taken Ibuprofen with minimal relief. Has tried Flexeril in the past with no relief. Indicates Zanaflex and oral steroids have helped in the past. No other chronic health issues except ADD. Currently on Adderall daily.   The history is provided by the patient.   Past Medical History:  Diagnosis Date   Hypertension    [place]   Medical history non-contributory     Patient Active Problem List   Diagnosis Date Noted   Pneumonia 11/04/2015   Gestational hypertension 10/29/2015   History of cervical LEEP biopsy affecting care of mother, antepartum 10/29/2015   Depression affecting pregnancy, antepartum 10/29/2015   GBS (group B Streptococcus carrier), +RV culture, currently pregnant 10/29/2015   History of precipitous delivery 10/28/2015   Obesity affecting pregnancy in third trimester, antepartum 10/28/2015   Female sterility 10/28/2015    Past Surgical History:  Procedure Laterality Date   CHOLECYSTECTOMY     LAPAROSCOPIC BILATERAL SALPINGECTOMY Bilateral 12/11/2015   Procedure: LAPAROSCOPIC BILATERAL SALPINGECTOMY;  Surgeon: Gae Dry, MD;  Location: [facility];  Service: Gynecology;  Laterality: Bilateral;   LAPAROSCOPY N/A 12/11/2015   Procedure: LAPAROSCOPY OPERATIVE;  Surgeon: Gae Dry, MD;  Location: [facility];  Service: Gynecology;   Laterality: N/A;   LEEP  2013   LEEP     NEXPLANON     REMOVAL    OB History     Gravida  5   Para  4   Term  4   Preterm      AB  1   Living  4      SAB  1   IAB      Ectopic      Multiple  0   Live Births  4            Home Medications    Prior to Admission medications   Medication Sig Start Date End Date Taking? Authorizing Provider  amphetamine-dextroamphetamine (ADDERALL) 20 MG tablet Take by mouth. 08/29/17  Yes [provider]  amphetamine-dextroamphetamine (ADDERALL) 5 MG tablet Take 5 mg by mouth daily.   Yes [provider]  HYDROcodone-acetaminophen (NORCO/VICODIN) 5-325 MG tablet Take 1-2 tablets by mouth every 6 (six) hours as needed for severe pain. 04/23/21  Yes Amiri Riechers, Nicholes Stairs, NP  ibuprofen (ADVIL) 800 MG tablet Take by mouth. 11/10/20 11/10/21 Yes [provider]  tiZANidine (ZANAFLEX) 4 MG tablet Take 1 tablet (4 mg total) by mouth every 8 (eight) hours as needed for muscle spasms. 04/23/21  Yes Calli Bashor, Nicholes Stairs, NP  predniSONE (STERAPRED UNI-PAK 21 TAB) 10 MG (21) TBPK tablet Take by mouth daily. Take 6 tabs by mouth daily  for 2 days, then 5 tabs for 2 days, then 4 tabs for 2 days, then  3 tabs for 2 days, 2 tabs for 2 days, then 1 tab by mouth daily for 2 days 04/23/21   Katy Apo, NP  labetalol (NORMODYNE) 100 MG tablet Take 100 mg by mouth 2 (two) times daily.  12/23/18  [provider]    Family History Family History  Problem Relation Age of Onset   CAD Father    COPD Father    Lung cancer Father    Cancer Mother    Lung cancer Mother     Social History Social History   Tobacco Use   Smoking status: Every Day    Packs/day: 0.50    Years: 10.00    Pack years: 5.00    Types: Cigarettes   Smokeless tobacco: Never  Vaping Use   Vaping Use: Never used  Substance Use Topics   Alcohol use: Yes    Alcohol/week: 0.0 standard drinks    Comment: rare   Drug use: No     Allergies    Penicillins and Other   Review of Systems Review of Systems  Constitutional:  Positive for activity change. Negative for appetite change, chills, fatigue and fever.  Respiratory:  Negative for chest tightness and shortness of breath.   Gastrointestinal:  Negative for nausea and vomiting.  Genitourinary:  Negative for decreased urine volume, difficulty urinating, flank pain and hematuria.  Musculoskeletal:  Positive for back pain and myalgias. Negative for neck pain and neck stiffness.  Skin:  Negative for color change and rash.  Allergic/Immunologic: Negative for environmental allergies, food allergies and immunocompromised state.  Neurological:  Negative for dizziness, tremors, seizures, syncope, weakness, light-headedness, numbness and headaches.  Hematological:  Negative for adenopathy. Does not bruise/bleed easily.    Physical Exam Triage Vital Signs ED Triage Vitals  Enc Vitals Group     BP 04/23/21 0827 121/84     Pulse Rate 04/23/21 0827 89     Resp 04/23/21 0827 18     Temp 04/23/21 0827 98.7 F (37.1 C)     Temp Source 04/23/21 0827 Oral     SpO2 04/23/21 0827 100 %     Weight 04/23/21 0825 190 lb (86.2 kg)     Height 04/23/21 0825 5\' 3"  (1.6 m)     Head Circumference --      Peak Flow --      Pain Score 04/23/21 0825 6     Pain Loc --      Pain Edu? --      Excl. in [place]? --    No data found.  Updated Vital Signs BP 121/84 (BP Location: Left Arm)   Pulse 89   Temp 98.7 F (37.1 C) (Oral)   Resp 18   Ht 5\' 3"  (1.6 m)   Wt 190 lb (86.2 kg)   LMP 04/06/2021   SpO2 100%   BMI 33.66 kg/m   Visual Acuity Right Eye Distance:   Left Eye Distance:   Bilateral Distance:    Right Eye Near:   Left Eye Near:    Bilateral Near:     Physical Exam Vitals and nursing note reviewed.  Constitutional:      General: She is awake. She is not in acute distress.    Appearance: She is well-developed and well-groomed.     Comments: She is sitting on the exam table in  no acute distress but appears in pain, especially when changing positions.   HENT:     Head: Normocephalic and atraumatic.  Right Ear: Hearing normal.     Left Ear: Hearing normal.  Eyes:     Extraocular Movements: Extraocular movements intact.     Conjunctiva/sclera: Conjunctivae normal.  Cardiovascular:     Rate and Rhythm: Normal rate and regular rhythm.     Heart sounds: Normal heart sounds. No murmur heard. Pulmonary:     Effort: Pulmonary effort is normal. No respiratory distress.     Breath sounds: Normal breath sounds and air entry. No decreased air movement. No decreased breath sounds, wheezing, rhonchi or rales.  Abdominal:     Tenderness: There is no right CVA tenderness or left CVA tenderness.  Musculoskeletal:        General: Tenderness present.     Cervical back: Normal, normal range of motion and neck supple.     Thoracic back: Normal.     Lumbar back: Spasms and tenderness present. No swelling, deformity or signs of trauma. Decreased range of motion.       Back:     Comments: Has decreased range of motion, especially with flexion and full extension. Unable to lay flat on table due to pain. Tender along lower lumbar area bilaterally. Occasional muscle spasm present. No rash, redness or swelling. Good reflexes and no neuro deficits noted.   Skin:    General: Skin is warm and dry.     Findings: No rash.  Neurological:     General: No focal deficit present.     Mental Status: She is alert and oriented to person, place, and time.     Sensory: Sensation is intact. No sensory deficit.     Motor: Motor function is intact.     Gait: Gait is intact.     Deep Tendon Reflexes: Reflexes are normal and symmetric.  Psychiatric:        Mood and Affect: Mood normal.        Behavior: Behavior normal. Behavior is cooperative.        Thought Content: Thought content normal.        Judgment: Judgment normal.     UC Treatments / Results  Labs (all labs ordered are listed, but  only abnormal results are displayed) Labs Reviewed - No data to display  EKG   Radiology No results found.  Procedures Procedures (including critical care time)  Medications Ordered in UC Medications - No data to display  Initial Impression / Assessment and Plan / UC Course  I have reviewed the triage vital signs and the nursing notes.  Pertinent labs & imaging results that were available during my care of the patient were reviewed by me and considered in my medical decision making (see chart for details).     Reviewed with patient that she probably has a lumbar muscle strain with spasms. Recommend start Prednisone 10mg  12 day dose pack as directed. Do not take additional Ibuprofen while on Prednisone. May use Zanaflex 4mg  every 8 hours as needed for muscle spasms. Continue to apply moist heat to area for comfort. May use Vicodin 1 to 2 tablets every 6 hours as needed for severe pain #12 with no refill provided. Note written for work. Discussed recurrent and chronic nature of back pain and may need additional therapies. Recommend contact [facility] for further evaluation.  Final Clinical Impressions(s) / UC Diagnoses   Final diagnoses:  Acute bilateral low back pain with bilateral sciatica  Muscle spasm of back     Discharge Instructions      Recommend start Prednisone 10mg  -  take 6 tablets today and tomorrow and then decrease by 1 tablet every 2 days until finished on day 12. Do not take any Ibuprofen while taking Prednisone. May take Zanaflex 4mg  every 8 hours as needed for muscle spasms. Continue to apply moist heat to area for comfort. May use Vicodin 1 to 2 tablets every 6 hours as needed for severe pain. Recommend contact [facility] for further evaluation of recurrent back pain.     ED Prescriptions     Medication Sig Dispense Auth. Provider   predniSONE (STERAPRED UNI-PAK 21 TAB) 10 MG (21) TBPK tablet Take by mouth daily. Take 6 tabs by mouth daily  for 2 days,  then 5 tabs for 2 days, then 4 tabs for 2 days, then 3 tabs for 2 days, 2 tabs for 2 days, then 1 tab by mouth daily for 2 days 42 tablet Oluwademilade Kellett, , NP   tiZANidine (ZANAFLEX) 4 MG tablet Take 1 tablet (4 mg total) by mouth every 8 (eight) hours as needed for muscle spasms. 20 tablet Ali Lowe, NP   HYDROcodone-acetaminophen (NORCO/VICODIN) 5-325 MG tablet Take 1-2 tablets by mouth every 6 (six) hours as needed for severe pain. 12 tablet Dell Briner, Sudie Grumbling, NP      I have reviewed the PDMP during this encounter. Patient has active Rx for Adderall monthly and last Rx for Vicodin was June 2022 for #12 and April 2022 #12. I feel the benefits outweigh the risks for a controlled medication at this time.    May 2022, NP 04/24/21 1158

## 2021-04-24 ENCOUNTER — Ambulatory Visit: Payer: Self-pay

## 2021-05-27 ENCOUNTER — Encounter: Payer: Self-pay | Admitting: Obstetrics and Gynecology

## 2021-12-03 ENCOUNTER — Ambulatory Visit
Admission: EM | Admit: 2021-12-03 | Discharge: 2021-12-03 | Disposition: A | Discharge status: Home / Self Care | Payer: Medicaid Other | Attending: Emergency Medicine | Admitting: Emergency Medicine

## 2021-12-03 ENCOUNTER — Encounter: Payer: Self-pay | Admitting: Emergency Medicine

## 2021-12-03 ENCOUNTER — Ambulatory Visit: Payer: Self-pay

## 2021-12-03 DIAGNOSIS — J029 Acute pharyngitis, unspecified: Secondary | ICD-10-CM | POA: Insufficient documentation

## 2021-12-03 LAB — GROUP A STREP BY PCR: Group A Strep by PCR: NOT DETECTED

## 2021-12-03 MED ORDER — TRAMADOL HCL 50 MG PO TABS: 50.0000 mg | ORAL_TABLET | Freq: Four times a day (QID) | ORAL | 0 refills | Status: AC | PRN

## 2021-12-03 MED ORDER — CLINDAMYCIN HCL 300 MG PO CAPS: 300.0000 mg | ORAL_CAPSULE | Freq: Three times a day (TID) | ORAL | 0 refills | Status: AC

## 2021-12-03 NOTE — Discharge Instructions (Signed)
Today we will begin bacterial coverage based on the presentation of your throat and accompanying fevers  Your strep test is pending, you will be called for positive results only  Begin use of clindamycin 3 times daily for the next 7 days, ideally should start to see improvement after about 24 to 48 hours and steady progression from there  You may use tramadol every 6 hours as needed for severe pain  May attempt use of salt water gargles, throat lozenges, warm liquids, teaspoons of honey and over-the-counter Chloraseptic spray for additional support  You may follow-up with this urgent care as needed if symptoms persist or worsen

## 2021-12-03 NOTE — ED Triage Notes (Signed)
Patient c/o sore throat and fever that started on Tuesday.

## 2021-12-03 NOTE — ED Provider Notes (Signed)
[facility]    CSN: [id number] Arrival date & time: 12/03/21  1725      History   Chief Complaint Chief Complaint  Patient presents with   Sore Throat   Fever    HPI Lisa Adkins is a 36 y.o. female.   Patient presents with sore throat , fever, bilateral ear pain, rhinorrhea for 4 days.  Symptoms worsening 1 day ago.  Has become painful to swallow and unable to tolerate foods, tolerating some liquids.  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  Home COVID test negative.  Has attempted use of Tylenol which has been helpful with fever management.  Denies coughing, shortness of breath, wheezing.       Past Medical History:  Diagnosis Date   Hypertension    [place]   Medical history non-contributory     Patient Active Problem List   Diagnosis Date Noted   Pneumonia 11/04/2015   Gestational hypertension 10/29/2015   History of cervical LEEP biopsy affecting care of mother, antepartum 10/29/2015   Depression affecting pregnancy, antepartum 10/29/2015   GBS (group B Streptococcus carrier), +RV culture, currently pregnant 10/29/2015   History of precipitous delivery 10/28/2015   Obesity affecting pregnancy in third trimester, antepartum 10/28/2015   Female sterility 10/28/2015    Past Surgical History:  Procedure Laterality Date   CHOLECYSTECTOMY     LAPAROSCOPIC BILATERAL SALPINGECTOMY Bilateral 12/11/2015   Procedure: LAPAROSCOPIC BILATERAL SALPINGECTOMY;  Surgeon: Nadara Mustard, MD;  Location: [facility];  Service: Gynecology;  Laterality: Bilateral;   LAPAROSCOPY N/A 12/11/2015   Procedure: LAPAROSCOPY OPERATIVE;  Surgeon: Nadara Mustard, MD;  Location: [facility];  Service: Gynecology;  Laterality: N/A;   LEEP  2013   LEEP     NEXPLANON     REMOVAL    OB History     Gravida  5   Para  4   Term  4   Preterm      AB  1   Living  4      SAB  1   IAB      Ectopic      Multiple  0   Live Births  4             Home Medications    Prior to Admission medications   Medication Sig Start Date End Date Taking? Authorizing Provider  amphetamine-dextroamphetamine (ADDERALL) 20 MG tablet Take by mouth. 08/29/17  Yes [provider]  amphetamine-dextroamphetamine (ADDERALL) 5 MG tablet Take 5 mg by mouth daily.   Yes [provider]  HYDROcodone-acetaminophen (NORCO/VICODIN) 5-325 MG tablet Take 1-2 tablets by mouth every 6 (six) hours as needed for severe pain. 04/23/21   Sudie Grumbling, NP  predniSONE (STERAPRED UNI-PAK 21 TAB) 10 MG (21) TBPK tablet Take by mouth daily. Take 6 tabs by mouth daily  for 2 days, then 5 tabs for 2 days, then 4 tabs for 2 days, then 3 tabs for 2 days, 2 tabs for 2 days, then 1 tab by mouth daily for 2 days 04/23/21   Sudie Grumbling, NP  tiZANidine (ZANAFLEX) 4 MG tablet Take 1 tablet (4 mg total) by mouth every 8 (eight) hours as needed for muscle spasms. 04/23/21   Sudie Grumbling, NP  labetalol (NORMODYNE) 100 MG tablet Take 100 mg by mouth 2 (two) times daily.  12/23/18  [provider]    Family History Family History  Problem Relation Age of Onset  CAD Father    COPD Father    Lung cancer Father    Cancer Mother    Lung cancer Mother     Social History Social History   Tobacco Use   Smoking status: Every Day    Packs/day: 0.50    Years: 10.00    Total pack years: 5.00    Types: Cigarettes   Smokeless tobacco: Never  Vaping Use   Vaping Use: Never used  Substance Use Topics   Alcohol use: Yes    Alcohol/week: 0.0 standard drinks of alcohol    Comment: rare   Drug use: No     Allergies   Penicillins and Other   Review of Systems Review of Systems  Constitutional:  Positive for fever. Negative for activity change, appetite change, chills, diaphoresis, fatigue and unexpected weight change.  HENT:  Positive for ear pain, rhinorrhea and sore throat. Negative for congestion, dental problem, drooling, ear discharge,  facial swelling, hearing loss, mouth sores, nosebleeds, postnasal drip, sinus pressure, sinus pain, sneezing, tinnitus, trouble swallowing and voice change.   Respiratory: Negative.    Cardiovascular: Negative.   Gastrointestinal: Negative.   Skin: Negative.   Neurological: Negative.      Physical Exam Triage Vital Signs ED Triage Vitals  Enc Vitals Group     BP 12/03/21 1742 125/90     Pulse Rate 12/03/21 1742 (!) 113     Resp 12/03/21 1742 14     Temp 12/03/21 1742 99.9 F (37.7 C)     Temp Source 12/03/21 1742 Oral     SpO2 12/03/21 1742 100 %     Weight 12/03/21 1740 185 lb (83.9 kg)     Height 12/03/21 1740 5\' 3"  (1.6 m)     Head Circumference --      Peak Flow --      Pain Score 12/03/21 1740 8     Pain Loc --      Pain Edu? --      Excl. in [place]? --    No data found.  Updated Vital Signs BP 125/90 (BP Location: Left Arm)   Pulse (!) 113   Temp 99.9 F (37.7 C) (Oral)   Resp 14   Ht 5\' 3"  (1.6 m)   Wt 185 lb (83.9 kg)   LMP 11/12/2021   SpO2 100%   BMI 32.77 kg/m   Visual Acuity Right Eye Distance:   Left Eye Distance:   Bilateral Distance:    Right Eye Near:   Left Eye Near:    Bilateral Near:     Physical Exam Constitutional:      Appearance: She is well-developed.  HENT:     Head: Normocephalic.     Right Ear: Tympanic membrane and ear canal normal.     Left Ear: Tympanic membrane and ear canal normal.     Nose: Congestion and rhinorrhea present.     Mouth/Throat:     Pharynx: Posterior oropharyngeal erythema present.     Tonsils: Tonsillar exudate present. 2+ on the right. 3+ on the left.  Cardiovascular:     Rate and Rhythm: Normal rate and regular rhythm.     Heart sounds: Normal heart sounds.  Pulmonary:     Effort: Pulmonary effort is normal.     Breath sounds: Normal breath sounds.  Musculoskeletal:     Cervical back: Normal range of motion.  Lymphadenopathy:     Cervical: Cervical adenopathy present.  Skin:    General: Skin is  warm and dry.  Neurological:     General: No focal deficit present.     Mental Status: She is alert and oriented to person, place, and time.  Psychiatric:        Mood and Affect: Mood normal.        Behavior: Behavior normal.      UC Treatments / Results  Labs (all labs ordered are listed, but only abnormal results are displayed) Labs Reviewed  GROUP A STREP BY PCR    EKG   Radiology No results found.  Procedures Procedures (including critical care time)  Medications Ordered in UC Medications - No data to display  Initial Impression / Assessment and Plan / UC Course  I have reviewed the triage vital signs and the nursing notes.  Pertinent labs & imaging results that were available during my care of the patient were reviewed by me and considered in my medical decision making (see chart for details).  Sore throat  Low-grade fever of 99.9 with associated tachycardia noted in triage, erythema, tonsillar adenopathy and exudate is noted to the oropharynx, strep test pending, will begin treatment based on examination, penicillin allergy confirmed, clindamycin prescribed for outpatient management as well as tramadol as patient endorses that she is in constant severe pain, PDMP reviewed, low risk, may use additional measures for supportive care and remaining symptoms, may follow-up with his urgent care as needed, work note given Final Clinical Impressions(s) / UC Diagnoses   Final diagnoses:  None   Discharge Instructions   None    ED Prescriptions   None    PDMP not reviewed this encounter.   Valinda Hoar, [place] 12/03/21 1806
